# Patient Record
Sex: Male | Born: 1970
Health system: Southern US, Community
[De-identification: ages and names within clinical notes are randomized; demographics above are authoritative.]

## PROBLEM LIST (undated history)

## (undated) DIAGNOSIS — E119 Type 2 diabetes mellitus without complications: Secondary | ICD-10-CM

## (undated) DIAGNOSIS — E785 Hyperlipidemia, unspecified: Secondary | ICD-10-CM

## (undated) DIAGNOSIS — I8390 Asymptomatic varicose veins of unspecified lower extremity: Secondary | ICD-10-CM

## (undated) DIAGNOSIS — K5909 Other constipation: Secondary | ICD-10-CM

## (undated) DIAGNOSIS — J189 Pneumonia, unspecified organism: Secondary | ICD-10-CM

## (undated) DIAGNOSIS — F17200 Nicotine dependence, unspecified, uncomplicated: Secondary | ICD-10-CM

## (undated) HISTORY — DX: Other constipation: K59.09

## (undated) HISTORY — DX: Asymptomatic varicose veins of unspecified lower extremity: I83.90

## (undated) HISTORY — DX: Hyperlipidemia, unspecified: E78.5

## (undated) HISTORY — DX: Pneumonia, unspecified organism: J18.9

## (undated) HISTORY — DX: Nicotine dependence, unspecified, uncomplicated: F17.200

---

## 2012-02-24 ENCOUNTER — Encounter (HOSPITAL_COMMUNITY): Payer: Self-pay | Admitting: Emergency Medicine

## 2012-02-24 ENCOUNTER — Emergency Department (HOSPITAL_COMMUNITY)
Admission: EM | Admit: 2012-02-24 | Discharge: 2012-02-24 | Disposition: A | Discharge status: Home / Self Care | Payer: Self-pay | Source: Home / Self Care

## 2012-02-24 DIAGNOSIS — S058X9A Other injuries of unspecified eye and orbit, initial encounter: Secondary | ICD-10-CM

## 2012-02-24 DIAGNOSIS — R7309 Other abnormal glucose: Secondary | ICD-10-CM

## 2012-02-24 DIAGNOSIS — S0500XA Injury of conjunctiva and corneal abrasion without foreign body, unspecified eye, initial encounter: Secondary | ICD-10-CM

## 2012-02-24 DIAGNOSIS — R739 Hyperglycemia, unspecified: Secondary | ICD-10-CM

## 2012-02-24 HISTORY — DX: Type 2 diabetes mellitus without complications: E11.9

## 2012-02-24 LAB — GLUCOSE, CAPILLARY: Glucose-Capillary: 444 mg/dL — ABNORMAL HIGH (ref 70–99)

## 2012-02-24 MED ORDER — TOBRAMYCIN 0.3 % OP SOLN: 1.0000 [drp] | OPHTHALMIC | Status: DC

## 2012-02-24 NOTE — ED Provider Notes (Signed)
Medical screening examination/treatment/procedure(s) were performed by non-physician practitioner and as supervising physician I was immediately available for consultation/collaboration.  Leslee Home, M.D.   Reuben Likes, MD 02/24/12 2137

## 2012-02-24 NOTE — ED Notes (Signed)
Reports child scratch patient in right eye.  Patient states eye is painful.  Tried flushing eye with water and used eye drops

## 2012-02-24 NOTE — ED Provider Notes (Signed)
History     CSN: [id number]  Arrival date & time 02/24/12  1523   None     Chief Complaint  Patient presents with  . Eye Pain    (Consider location/radiation/quality/duration/timing/severity/associated sxs/prior treatment) Patient is a 41 y.o. male presenting with eye injury. The history is provided by the patient. No language interpreter was used.  Eye Injury This is a new problem. The current episode started yesterday. The problem occurs constantly. The problem has been gradually worsening. Pertinent negatives include no chest pain. Nothing aggravates the symptoms. Nothing relieves the symptoms. He has tried nothing for the symptoms.  Pt reports his child accidentally scratched his eye yesterday  Past Medical History  Diagnosis Date  . Diabetes mellitus without complication     History reviewed. No pertinent past surgical history.  No family history on file.  History  Substance Use Topics  . Smoking status: Not on file  . Smokeless tobacco: Not on file  . Alcohol Use:       Review of Systems  Eyes: Positive for pain and redness.  Cardiovascular: Negative for chest pain.  All other systems reviewed and are negative.    Allergies  Review of patient's allergies indicates no known allergies.  Home Medications   Current Outpatient Rx  Name  Route  Sig  Dispense  Refill  . METFORMIN HCL PO   Oral   Take by mouth.           BP 138/89  Pulse 80  Temp 98.6 F (37 C) (Oral)  Resp 18  SpO2 100%  Physical Exam  Nursing note and vitals reviewed. Constitutional: He appears well-developed and well-nourished.  HENT:  Head: Normocephalic and atraumatic.  Eyes: Conjunctivae normal are normal. Pupils are equal, round, and reactive to light.       Small round area 6oclock right eye has fluro uptake  Neck: Normal range of motion. Neck supple.  Cardiovascular: Normal rate.   Pulmonary/Chest: Effort normal.    ED Course  Procedures (including critical care  time)  Labs Reviewed  GLUCOSE, CAPILLARY - Abnormal; Notable for the following:    Glucose-Capillary 444 (*)     All other components within normal limits   No results found.   No diagnosis found.    MDM  tobrex opth sol.  Pt's glucose level is 444.  Pt did not take his medication.   Pt advised he needs a primary Md and he needs to take his medications    r 20/70 l20/50 both 20/50    Lonia Skinner [place], [place] 02/24/12 1759  Lonia Skinner [place], [place] 02/24/12 1821

## 2012-08-01 ENCOUNTER — Emergency Department (HOSPITAL_COMMUNITY)
Admission: EM | Admit: 2012-08-01 | Discharge: 2012-08-01 | Disposition: A | Discharge status: Home / Self Care | Payer: Self-pay

## 2014-01-12 ENCOUNTER — Ambulatory Visit (INDEPENDENT_AMBULATORY_CARE_PROVIDER_SITE_OTHER): Payer: 59 | Admitting: Medical

## 2014-01-12 ENCOUNTER — Encounter: Payer: Self-pay | Admitting: Medical

## 2014-01-12 VITALS — BP 110/82 | HR 92 | Temp 97.5°F | Resp 16 | Ht 68.0 in | Wt 136.0 lb

## 2014-01-12 DIAGNOSIS — R109 Unspecified abdominal pain: Secondary | ICD-10-CM

## 2014-01-12 DIAGNOSIS — E1165 Type 2 diabetes mellitus with hyperglycemia: Secondary | ICD-10-CM

## 2014-01-12 DIAGNOSIS — K5900 Constipation, unspecified: Secondary | ICD-10-CM

## 2014-01-12 DIAGNOSIS — IMO0002 Reserved for concepts with insufficient information to code with codable children: Secondary | ICD-10-CM

## 2014-01-12 LAB — POCT URINALYSIS DIPSTICK
BILIRUBIN UA: NEGATIVE
Blood, UA: NEGATIVE
GLUCOSE UA: 500
KETONES UA: NEGATIVE
LEUKOCYTES UA: NEGATIVE
Nitrite, UA: NEGATIVE
PH UA: 6
Protein, UA: NEGATIVE
Spec Grav, UA: <=1.005
Urobilinogen, UA: NEGATIVE

## 2014-01-12 LAB — LIPID PANEL
CHOL/HDL RATIO: 3.9 ratio
CHOLESTEROL: 130 mg/dL (ref 0–200)
HDL: 33 mg/dL — ABNORMAL LOW (ref >39)
LDL Cholesterol: 53 mg/dL (ref 0–99)
TRIGLYCERIDES: 220 mg/dL — AB (ref <150)
VLDL: 44 mg/dL — AB (ref 0–40)

## 2014-01-12 LAB — CBC
HCT: 45.4 % (ref 39.0–52.0)
HEMOGLOBIN: 15.7 g/dL (ref 13.0–17.0)
MCH: 28.3 pg (ref 26.0–34.0)
MCHC: 34.6 g/dL (ref 30.0–36.0)
MCV: 81.9 fL (ref 78.0–100.0)
Platelets: 183 10*3/uL (ref 150–400)
RBC: 5.54 MIL/uL (ref 4.22–5.81)
RDW: 13.3 % (ref 11.5–15.5)
WBC: 12 10*3/uL — ABNORMAL HIGH (ref 4.0–10.5)

## 2014-01-12 LAB — COMPREHENSIVE METABOLIC PANEL
ALT: 29 U/L (ref 0–53)
AST: 16 U/L (ref 0–37)
Albumin: 4.4 g/dL (ref 3.5–5.2)
Alkaline Phosphatase: 134 U/L — ABNORMAL HIGH (ref 39–117)
BILIRUBIN TOTAL: 1 mg/dL (ref 0.2–1.2)
BUN: 11 mg/dL (ref 6–23)
CALCIUM: 9.6 mg/dL (ref 8.4–10.5)
CHLORIDE: 97 meq/L (ref 96–112)
CO2: 27 meq/L (ref 19–32)
CREATININE: 0.81 mg/dL (ref 0.50–1.35)
GLUCOSE: 321 mg/dL — AB (ref 70–99)
Potassium: 4.4 mEq/L (ref 3.5–5.3)
Sodium: 134 mEq/L — ABNORMAL LOW (ref 135–145)
TOTAL PROTEIN: 7.9 g/dL (ref 6.0–8.3)

## 2014-01-12 MED ORDER — DOCUSATE SODIUM 100 MG PO CAPS: 100.0000 mg | ORAL_CAPSULE | Freq: Two times a day (BID) | ORAL | Status: DC

## 2014-01-12 MED ORDER — POLYETHYLENE GLYCOL 3350 17 GM/SCOOP PO POWD: 17.0000 g | Freq: Two times a day (BID) | ORAL | Status: DC | PRN

## 2014-01-12 NOTE — Patient Instructions (Addendum)
Constipation:  Begin Miralax 1 cap full in a beverage daily to help with constipation  Begin Colace stool softener, twice daily to help ease stool passage  Make sure you are eating plenty of fiber daily  increase your water intake, preferably 4 liters or more daily  Avoid foods that make you constipated or gassy such as onions, lots of cheese, big portions  We will call back with labs results  If you don't see improvement in 2-3 weeks, we may need to proceed with other evaluation  Constipation Constipation is when a person has fewer than three bowel movements a week, has difficulty having a bowel movement, or has stools that are dry, hard, or larger than normal. As people grow older, constipation is more common. If you try to fix constipation with medicines that make you have a bowel movement (laxatives), the problem may get worse. Long-term laxative use may cause the muscles of the colon to become weak. A low-fiber diet, not taking in enough fluids, and taking certain medicines may make constipation worse.  CAUSES   Certain medicines, such as antidepressants, pain medicine, iron supplements, antacids, and water pills.   Certain diseases, such as diabetes, irritable bowel syndrome (IBS), thyroid disease, or depression.   Not drinking enough water.   Not eating enough fiber-rich foods.   Stress or travel.   Lack of physical activity or exercise.   Ignoring the urge to have a bowel movement.   Using laxatives too much.  SIGNS AND SYMPTOMS   Having fewer than three bowel movements a week.   Straining to have a bowel movement.   Having stools that are hard, dry, or larger than normal.   Feeling full or bloated.   Pain in the lower abdomen.   Not feeling relief after having a bowel movement.  DIAGNOSIS  Your health care provider will take a medical history and perform a physical exam. Further testing may be done for severe constipation. Some tests may  include:  A barium enema X-ray to examine your rectum, colon, and, sometimes, your small intestine.   A sigmoidoscopy to examine your lower colon.   A colonoscopy to examine your entire colon. TREATMENT  Treatment will depend on the severity of your constipation and what is causing it. Some dietary treatments include drinking more fluids and eating more fiber-rich foods. Lifestyle treatments may include regular exercise. If these diet and lifestyle recommendations do not help, your health care provider may recommend taking over-the-counter laxative medicines to help you have bowel movements. Prescription medicines may be prescribed if over-the-counter medicines do not work.  HOME CARE INSTRUCTIONS   Eat foods that have a lot of fiber, such as fruits, vegetables, whole grains, and beans.  Limit foods high in fat and processed sugars, such as french fries, hamburgers, cookies, candies, and soda.   A fiber supplement may be added to your diet if you cannot get enough fiber from foods.   Drink enough fluids to keep your urine clear or pale yellow.   Exercise regularly or as directed by your health care provider.   Go to the restroom when you have the urge to go. Do not hold it.   Only take over-the-counter or prescription medicines as directed by your health care provider. Do not take other medicines for constipation without talking to your health care provider first.  SEEK IMMEDIATE MEDICAL CARE IF:   You have bright red blood in your stool.   Your constipation lasts for more than 4  days or gets worse.   You have abdominal or rectal pain.   You have thin, pencil-like stools.   You have unexplained weight loss. MAKE SURE YOU:   Understand these instructions.  Will watch your condition.  Will get help right away if you are not doing well or get worse. Document Released: 12/23/2003 Document Revised: 03/31/2013 Document Reviewed: 01/05/2013 [facility] Patient  Information 2015 [place], [place]. This information is not intended to replace advice given to you by your health care provider. Make sure you discuss any questions you have with your health care provider.

## 2014-01-12 NOTE — Progress Notes (Signed)
    Subjective:   Gordon Perez is a 43 y.o. male presenting on 01/12/2014 with stomach pain and hard to go to the bathroom  Here as a new patient today, accompanied by wife who is also establishing care today.    Originally from [place], moved to [place] 15 years ago, been in [place].   Has been in usual state of health until 3 weeks ago.  Been having hard to pass stool, small amounts come out, bloated, intermittent belly pain, at times decreased appetite, bloating, gas.  Had to strain very hard this past weekend for BM.   Last 2 weeks having decreased frequency of BMs.  Prior to 3 wk had no issues with gastric problems, no hx/o constipation.    He is not certain he has diabetes, but apparently has been on medication prior.   He drinks some water.  The constipation started after his scheduled changes in mid September.  Coffee did seem to keep things regular but not drinking much coffee since the schedule changes at work/home.  He was on 2 medications for sugar including metformin just last year.     He denies urinary c/o, no blood or mucous in stool, no diarrhea, no nausea or vomiting, no back pain, no weight loss, fever, no family hx/o cancer or GI issues.  No other aggravating or relieving factors.  No other complaint.  Review of Systems ROS as in subjective      Objective:    Filed Vitals:   01/12/14 1113  BP: 110/82  Pulse: 92  Temp: 97.5 F (36.4 C)  Resp: 16    General appearance: alert, no distress, WD/WN, lean [place] male Oral cavity: MMM, some obvious decay in bilat upper posterior molars Neck: supple, no lymphadenopathy, no thyromegaly, no masses Heart: RRR, normal S1, S2, no murmurs Lungs: CTA bilaterally, no wheezes, rhonchi, or rales Abdomen: +bs, soft, non tender, non distended, no masses, no hepatomegaly, no splenomegaly Back: nontender Pulses: 2+ symmetric, upper and lower extremities, normal cap refill DRE - deferred     Assessment: Encounter  Diagnoses  Name Primary?  . Constipation, unspecified constipation type Yes  . Abdominal pain, unspecified abdominal location   . Diabetes type 2, uncontrolled      Plan: Constipation, abdominal pain - etiology seems to be constipation.   Discussed water and fiber intake, can begin Colace and Miralax daily, and will check labs.   handout given.  Diabetes type 2 - he doesn't 100% agree with the diagnosis, says he "was" diabetic, says he is normal now, and was on 2 medications for sugar as of last year.   Labs today for baseline.   [name] was seen today for stomach pain and hard to go to the bathroom.  Diagnoses and associated orders for this visit:  Constipation, unspecified constipation type - CBC - Comprehensive metabolic panel - Lipid panel - Hemoglobin A1c - POCT urinalysis dipstick  Abdominal pain, unspecified abdominal location - POCT urinalysis dipstick  Diabetes type 2, uncontrolled - CBC - Comprehensive metabolic panel - Lipid panel - Hemoglobin A1c - POCT urinalysis dipstick  Other Orders - polyethylene glycol powder (GLYCOLAX/MIRALAX) powder; Take 17 g by mouth 2 (two) times daily as needed. - docusate sodium (COLACE) 100 MG capsule; Take 1 capsule (100 mg total) by mouth 2 (two) times daily.    Return pending labs.

## 2014-01-13 LAB — HEMOGLOBIN A1C
Hgb A1c MFr Bld: 11.8 % — ABNORMAL HIGH (ref <5.7)
Mean Plasma Glucose: 292 mg/dL — ABNORMAL HIGH (ref <117)

## 2014-01-19 ENCOUNTER — Ambulatory Visit (INDEPENDENT_AMBULATORY_CARE_PROVIDER_SITE_OTHER): Payer: 59 | Admitting: Medical

## 2014-01-19 ENCOUNTER — Encounter: Payer: Self-pay | Admitting: Medical

## 2014-01-19 VITALS — BP 110/70 | HR 76 | Temp 97.4°F | Resp 16 | Wt 134.0 lb

## 2014-01-19 DIAGNOSIS — K5900 Constipation, unspecified: Secondary | ICD-10-CM

## 2014-01-19 DIAGNOSIS — E8881 Metabolic syndrome: Secondary | ICD-10-CM

## 2014-01-19 DIAGNOSIS — IMO0002 Reserved for concepts with insufficient information to code with codable children: Secondary | ICD-10-CM

## 2014-01-19 DIAGNOSIS — E1165 Type 2 diabetes mellitus with hyperglycemia: Secondary | ICD-10-CM

## 2014-01-19 MED ORDER — METFORMIN HCL 1000 MG PO TABS: 1000.0000 mg | ORAL_TABLET | Freq: Two times a day (BID) | ORAL | Status: DC

## 2014-01-19 MED ORDER — GLIPIZIDE 5 MG PO TABS: 5.0000 mg | ORAL_TABLET | Freq: Two times a day (BID) | ORAL | Status: DC

## 2014-01-19 NOTE — Progress Notes (Signed)
   Subjective:   Gordon Perez is a 43 y.o. male presenting on 01/19/2014 with Follow-up  Here for f/u from recent visit as a new patient.  So far getting some improvement with Miralax.  Has qeustions about the colace.  Hasn't started metformin as there was confusion over next steps.  He notes diagnosis of diabetes type 2 within last 5 years, but not sure of the date.   [place] state, was begun initially on Metformin and Glipizide.  originally HgbA1C was over 10, but after medications and diet changes, got the HgbA1C down into the 8% range.  He c/t the same medication for a period of time then lost insurance, ran out of medication.  Hasn't been on the medication for several months.     He is very active, always moving, owns a store.   He doesn't eat sweets, fast food, rarely has alcohol.  Meals usually consists of curry with chicken, goat or fish, vegetables, and rice at least twice daily, but not necessarily 3 meals ever day.  Doesn't eat junk food, no soda, no sweet tea.   He has been on no prior medications other than metformin and glipizide.  No other aggravating or relieving factors.  No other complaint.  Review of Systems ROS as in subjective      Objective:     Filed Vitals:   01/19/14 1059  BP: 110/70  Pulse: 76  Temp: 97.4 F (36.3 C)  Resp: 16    General appearance: alert, no distress, WD/WN    Assessment: Encounter Diagnoses  Name Primary?  . Diabetes type 2, uncontrolled Yes  . Constipation, unspecified constipation type   . Insulin resistance      Plan: Discussed diagnosis of diabetes, how it is a chronic disease, possible complications, preventative measures, and importance of good control.   Discussed need for strict diet, exercise, and medications to get to goal.  He refuses any injection today.  He only wants to restart what he was taking prior.  Advised that given the insulin resistance, and the fact that he already practices a pretty healthy  lifestyle and is lean, he really would benefit with basal insulin on board, but he refuses at this time.   We demonstrated use of glucometer, and he will check fasting 2 x /week and keep a log.   He will make some diet modifications we discussed, will try and increase exercise, will begin back on Metformin 1000mg  BID, glipizide 5mg  BID, and recheck in 44mo.    constipation - improved, c/t miralax daily, colace prn.    F/u 3 mo, sooner prn.   [name] was seen today for follow-up.  Diagnoses and associated orders for this visit:  Diabetes type 2, uncontrolled  Constipation, unspecified constipation type  Insulin resistance  Other Orders - metFORMIN (GLUCOPHAGE) 1000 MG tablet; Take 1 tablet (1,000 mg total) by mouth 2 (two) times daily with a meal. - glipiZIDE (GLUCOTROL) 5 MG tablet; Take 1 tablet (5 mg total) by mouth 2 (two) times daily before a meal.     Return in about 3 months (around 04/21/2014).

## 2014-10-24 ENCOUNTER — Encounter (HOSPITAL_COMMUNITY): Payer: Self-pay | Admitting: Emergency Medicine

## 2014-10-24 ENCOUNTER — Emergency Department (INDEPENDENT_AMBULATORY_CARE_PROVIDER_SITE_OTHER)
Admission: EM | Admit: 2014-10-24 | Discharge: 2014-10-24 | Disposition: A | Discharge status: Home / Self Care | Payer: 59 | Source: Home / Self Care | Attending: Family Medicine | Admitting: Family Medicine

## 2014-10-24 DIAGNOSIS — R05 Cough: Secondary | ICD-10-CM | POA: Diagnosis not present

## 2014-10-24 DIAGNOSIS — J209 Acute bronchitis, unspecified: Secondary | ICD-10-CM | POA: Diagnosis not present

## 2014-10-24 DIAGNOSIS — Z720 Tobacco use: Secondary | ICD-10-CM

## 2014-10-24 DIAGNOSIS — R059 Cough, unspecified: Secondary | ICD-10-CM

## 2014-10-24 LAB — POCT RAPID STREP A: Streptococcus, Group A Screen (Direct): NEGATIVE

## 2014-10-24 MED ORDER — METHYLPREDNISOLONE ACETATE 80 MG/ML IJ SUSP
80.0000 mg | Freq: Once | INTRAMUSCULAR | Status: AC
  Administered 2014-10-24: 80 mg via INTRAMUSCULAR

## 2014-10-24 MED ORDER — METHYLPREDNISOLONE ACETATE 80 MG/ML IJ SUSP
INTRAMUSCULAR | Status: AC
  Filled 2014-10-24: qty 1

## 2014-10-24 NOTE — ED Notes (Signed)
C/o cold sx onset 3 days Sx include dry cough, fevers, ST Smokes 1 PPD Taking OTC ibup w/no relief Alert, no signs of acute distress.

## 2014-10-24 NOTE — ED Provider Notes (Signed)
CSN: [id number]     Arrival date & time 10/24/14  1722 History   First MD Initiated Contact with Patient 10/24/14 1744     Chief Complaint  Patient presents with  . URI   (Consider location/radiation/quality/duration/timing/severity/associated sxs/prior Treatment) HPI Comments: Patient is a 44 yo male who presents with 3 days of cough; non-productive. Some malaise and subjective fevers. Mild sore throat. Smokes 1-2 ppd. No sob.   Patient is a 44 y.o. male presenting with URI. The history is provided by the patient.  URI Presenting symptoms: cough, fatigue, fever and sore throat   Associated symptoms: no wheezing     Past Medical History  Diagnosis Date  . Diabetes mellitus without complication   . Smoker    History reviewed. No pertinent past surgical history. No family history on file. History  Substance Use Topics  . Smoking status: Current Every Day Smoker -- 1.00 packs/day for 20 years  . Smokeless tobacco: Not on file  . Alcohol Use: No    Review of Systems  Constitutional: Positive for fever and fatigue. Negative for chills.  HENT: Positive for sore throat. Negative for sinus pressure.   Eyes: Negative.   Respiratory: Positive for cough. Negative for shortness of breath, wheezing and stridor.   Skin: Negative.   Allergic/Immunologic: Negative.   Neurological: Negative.     Allergies  Review of patient's allergies indicates no known allergies.  Home Medications   Prior to Admission medications   Medication Sig Start Date End Date Taking? Authorizing Provider  docusate sodium (COLACE) 100 MG capsule Take 1 capsule (100 mg total) by mouth 2 (two) times daily. 01/12/14   Kermit Baloavid S Tysinger, PA-C  glipiZIDE (GLUCOTROL) 5 MG tablet Take 1 tablet (5 mg total) by mouth 2 (two) times daily before a meal. 01/19/14   Jac Canavanavid S Tysinger, PA-C  metFORMIN (GLUCOPHAGE) 1000 MG tablet Take 1 tablet (1,000 mg total) by mouth 2 (two) times daily with a meal. 01/19/14   Kermit Baloavid S  Tysinger, PA-C  polyethylene glycol powder (GLYCOLAX/MIRALAX) powder Take 17 g by mouth 2 (two) times daily as needed. 01/12/14   Kermit Baloavid S Tysinger, PA-C   BP 118/80 mmHg  Pulse 90  Temp(Src) 98.1 F (36.7 C) (Oral)  Resp 16  SpO2 99% Physical Exam  Constitutional: He is oriented to person, place, and time. He appears well-developed and well-nourished. No distress.  HENT:  Head: Normocephalic and atraumatic.  Right Ear: External ear normal.  Left Ear: External ear normal.  Injection to oro pharynx, no exudate  Cardiovascular: Normal rate and regular rhythm.   No murmur heard. Pulmonary/Chest: Effort normal. He has wheezes.  Mild expiratory wheezes in the bases without crackles or rales  Neurological: He is alert and oriented to person, place, and time.  Skin: Skin is warm and dry. No rash noted. He is not diaphoretic.  Psychiatric: His behavior is normal.  Nursing note and vitals reviewed.   ED Course  Procedures (including critical care time) Labs Review Labs Reviewed  POCT RAPID STREP A    Imaging Review No results found.   MDM   1. Acute bronchitis, unspecified organism   2. Cough   3. Tobacco use    Probable early bronchitis-viral. No indication for antibiotic at this stage. Will provide a DepoMedrol 80mg  IM injection for wheeze/inflammation. Refused inhaler. Use Delsym OTC for cough as directed. Push fluids. Discussed importance of smoking cessation. F/U if worsen.     Riki SheerMichelle G Amiliana Foutz, PA-C 10/24/14 [id number]

## 2014-10-24 NOTE — Discharge Instructions (Signed)
Acute Bronchitis Bronchitis is inflammation of the airways that extend from the windpipe into the lungs (bronchi). The inflammation often causes mucus to develop. This leads to a cough, which is the most common symptom of bronchitis.  In acute bronchitis, the condition usually develops suddenly and goes away over time, usually in a couple weeks. Smoking, allergies, and asthma can make bronchitis worse. Repeated episodes of bronchitis may cause further lung problems.  CAUSES Acute bronchitis is most often caused by the same virus that causes a cold. The virus can spread from person to person (contagious) through coughing, sneezing, and touching contaminated objects. SIGNS AND SYMPTOMS   Cough.   Fever.   Coughing up mucus.   Body aches.   Chest congestion.   Chills.   Shortness of breath.   Sore throat.  DIAGNOSIS  Acute bronchitis is usually diagnosed through a physical exam. Your health care provider will also ask you questions about your medical history. Tests, such as chest X-rays, are sometimes done to rule out other conditions.  TREATMENT  Acute bronchitis usually goes away in a couple weeks. Oftentimes, no medical treatment is necessary. Medicines are sometimes given for relief of fever or cough. Antibiotic medicines are usually not needed but may be prescribed in certain situations. In some cases, an inhaler may be recommended to help reduce shortness of breath and control the cough. A cool mist vaporizer may also be used to help thin bronchial secretions and make it easier to clear the chest.  HOME CARE INSTRUCTIONS  Get plenty of rest.   Drink enough fluids to keep your urine clear or pale yellow (unless you have a medical condition that requires fluid restriction). Increasing fluids may help thin your respiratory secretions (sputum) and reduce chest congestion, and it will prevent dehydration.   Take medicines only as directed by your health care provider.  If  you were prescribed an antibiotic medicine, finish it all even if you start to feel better.  Avoid smoking and secondhand smoke. Exposure to cigarette smoke or irritating chemicals will make bronchitis worse. If you are a smoker, consider using nicotine gum or skin patches to help control withdrawal symptoms. Quitting smoking will help your lungs heal faster.   Reduce the chances of another bout of acute bronchitis by washing your hands frequently, avoiding people with cold symptoms, and trying not to touch your hands to your mouth, nose, or eyes.   Keep all follow-up visits as directed by your health care provider.  SEEK MEDICAL CARE IF: Your symptoms do not improve after 1 week of treatment.  SEEK IMMEDIATE MEDICAL CARE IF:  You develop an increased fever or chills.   You have chest pain.   You have severe shortness of breath.  You have bloody sputum.   You develop dehydration.  You faint or repeatedly feel like you are going to pass out.  You develop repeated vomiting.  You develop a severe headache. MAKE SURE YOU:   Understand these instructions.  Will watch your condition.  Will get help right away if you are not doing well or get worse. Document Released: 05/03/2004 Document Revised: 08/10/2013 Document Reviewed: 09/16/2012 [facility] Patient Information 2015 [place]. This information is not intended to replace advice given to you by your health care provider. Make sure you discuss any questions you have with your health care provider.   This is viral and do not require antibiotics. Please get Delsym over the counter for cough, works great for night. The shot  of steroids will help with the mild wheeze. Must find a way to stop smoking.

## 2014-10-27 LAB — CULTURE, GROUP A STREP

## 2014-10-29 ENCOUNTER — Encounter: Payer: Self-pay | Admitting: Family Medicine

## 2014-10-29 ENCOUNTER — Ambulatory Visit (INDEPENDENT_AMBULATORY_CARE_PROVIDER_SITE_OTHER): Payer: 59 | Admitting: Family Medicine

## 2014-10-29 VITALS — BP 100/70 | HR 80 | Temp 98.0°F | Wt 128.0 lb

## 2014-10-29 DIAGNOSIS — Z720 Tobacco use: Secondary | ICD-10-CM | POA: Diagnosis not present

## 2014-10-29 DIAGNOSIS — IMO0002 Reserved for concepts with insufficient information to code with codable children: Secondary | ICD-10-CM

## 2014-10-29 DIAGNOSIS — E1165 Type 2 diabetes mellitus with hyperglycemia: Secondary | ICD-10-CM | POA: Diagnosis not present

## 2014-10-29 DIAGNOSIS — F17200 Nicotine dependence, unspecified, uncomplicated: Secondary | ICD-10-CM

## 2014-10-29 DIAGNOSIS — J209 Acute bronchitis, unspecified: Secondary | ICD-10-CM | POA: Diagnosis not present

## 2014-10-29 MED ORDER — CLARITHROMYCIN 500 MG PO TABS: 500.0000 mg | ORAL_TABLET | Freq: Two times a day (BID) | ORAL | Status: DC

## 2014-10-29 MED ORDER — HYDROCOD POLST-CPM POLST ER 10-8 MG/5ML PO SUER
5.0000 mL | Freq: Two times a day (BID) | ORAL | Status: DC | PRN
Start: 2014-10-29 — End: 2015-05-24

## 2014-10-29 NOTE — Progress Notes (Signed)
   Subjective:    Patient ID: Gordon Perez, male    DOB: [birth year], 44 y.o.   MRN: [id number]  HPI He is here for an acute visit. He was seen on the 17th in urgent care and treated for bronchitis with a sterile shot. He continues to complain of coughing that is nonproductive. No fever, chills, chest pain, shortness of breath, sore throat or earache. He does smoke but has cut back on that since he has had the difficulty with the coughing. Review of the record indicates he does have underlying diabetes but has not been seen in quite some time.   Review of Systems     Objective:   Physical Exam Alert and in no distress. Tympanic membranes and canals are normal. Pharyngeal area is normal. Neck is supple without adenopathy or thyromegaly. Cardiac exam shows a regular sinus rhythm without murmurs or gallops. Lungs are clear to auscultation. The urgent care visit was reviewed. He was given a sterile injection his strep screen was negative.       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 He is also to set up a follow-up appointment for his diabetes. Encouraged him to remain smoke free.

## 2014-10-29 NOTE — Patient Instructions (Signed)
Take all the antibiotic and use the cough medicine as needed but be careful because it will make you drowsy. No driving and I wouldn't take it and then go to work either

## 2014-10-29 NOTE — ED Notes (Signed)
Final report of strep testing positive for group B hemolytic strep, no r

## 2014-10-29 NOTE — ED Notes (Signed)
Final report of strep positive for group B hemolytic strep, no rx day of visit. Discussed w Dr Artis Flock, who authorized Amoxicillin 500 mg, PO , TID x 30. Called and left detailed message on pharmacy voice mail, store listed as choice on day of visit. Attempted to notify patient, but VM box full

## 2015-05-24 ENCOUNTER — Ambulatory Visit (INDEPENDENT_AMBULATORY_CARE_PROVIDER_SITE_OTHER): Payer: BLUE CROSS/BLUE SHIELD | Admitting: Medical

## 2015-05-24 ENCOUNTER — Encounter: Payer: Self-pay | Admitting: Medical

## 2015-05-24 VITALS — BP 112/90 | HR 77 | Ht 67.5 in | Wt 131.0 lb

## 2015-05-24 DIAGNOSIS — E118 Type 2 diabetes mellitus with unspecified complications: Secondary | ICD-10-CM

## 2015-05-24 DIAGNOSIS — Z720 Tobacco use: Secondary | ICD-10-CM | POA: Diagnosis not present

## 2015-05-24 DIAGNOSIS — Z0000 Encounter for general adult medical examination without abnormal findings: Secondary | ICD-10-CM | POA: Diagnosis not present

## 2015-05-24 DIAGNOSIS — K5900 Constipation, unspecified: Secondary | ICD-10-CM

## 2015-05-24 DIAGNOSIS — F17200 Nicotine dependence, unspecified, uncomplicated: Secondary | ICD-10-CM | POA: Insufficient documentation

## 2015-05-24 DIAGNOSIS — L723 Sebaceous cyst: Secondary | ICD-10-CM

## 2015-05-24 DIAGNOSIS — Z23 Encounter for immunization: Secondary | ICD-10-CM

## 2015-05-24 DIAGNOSIS — K5909 Other constipation: Secondary | ICD-10-CM | POA: Insufficient documentation

## 2015-05-24 LAB — COMPREHENSIVE METABOLIC PANEL
ALT: 21 U/L (ref 9–46)
AST: 17 U/L (ref 10–40)
Albumin: 4.3 g/dL (ref 3.6–5.1)
Alkaline Phosphatase: 108 U/L (ref 40–115)
BUN: 12 mg/dL (ref 7–25)
CHLORIDE: 101 mmol/L (ref 98–110)
CO2: 29 mmol/L (ref 20–31)
CREATININE: 0.76 mg/dL (ref 0.60–1.35)
Calcium: 9.6 mg/dL (ref 8.6–10.3)
GLUCOSE: 196 mg/dL — AB (ref 65–99)
Potassium: 4.7 mmol/L (ref 3.5–5.3)
SODIUM: 137 mmol/L (ref 135–146)
Total Bilirubin: 1.5 mg/dL — ABNORMAL HIGH (ref 0.2–1.2)
Total Protein: 7.5 g/dL (ref 6.1–8.1)

## 2015-05-24 LAB — LIPID PANEL
Cholesterol: 154 mg/dL (ref 125–200)
HDL: 34 mg/dL — ABNORMAL LOW (ref >=40)
LDL CALC: 84 mg/dL (ref <130)
TRIGLYCERIDES: 178 mg/dL — AB (ref <150)
Total CHOL/HDL Ratio: 4.5 Ratio (ref <=5.0)
VLDL: 36 mg/dL — AB (ref <30)

## 2015-05-24 LAB — POCT URINALYSIS DIPSTICK
Bilirubin, UA: NEGATIVE
Blood, UA: NEGATIVE
Ketones, UA: NEGATIVE
Leukocytes, UA: NEGATIVE
Nitrite, UA: NEGATIVE
Protein, UA: NEGATIVE
SPEC GRAV UA: 1.025
UROBILINOGEN UA: NEGATIVE
pH, UA: 6

## 2015-05-24 LAB — CBC
HCT: 46.9 % (ref 39.0–52.0)
Hemoglobin: 15.6 g/dL (ref 13.0–17.0)
MCH: 28.3 pg (ref 26.0–34.0)
MCHC: 33.3 g/dL (ref 30.0–36.0)
MCV: 85 fL (ref 78.0–100.0)
MPV: 11.1 fL (ref 8.6–12.4)
PLATELETS: 180 10*3/uL (ref 150–400)
RBC: 5.52 MIL/uL (ref 4.22–5.81)
RDW: 14 % (ref 11.5–15.5)
WBC: 11.1 10*3/uL — AB (ref 4.0–10.5)

## 2015-05-24 LAB — HEMOGLOBIN A1C
Hgb A1c MFr Bld: 11.1 % — ABNORMAL HIGH (ref <5.7)
Mean Plasma Glucose: 272 mg/dL — ABNORMAL HIGH (ref <117)

## 2015-05-24 NOTE — Progress Notes (Signed)
Subjective:   HPI  Gordon Perez is a 45 y.o. male who presents for a complete physical.  Concerns: Diabetes  - since last visit quit medications.  He was having dizziness with diabetes medications, so stopped. Not compliant with medication.   He notes he is eating healthy.   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    Has lump on right forehead x 10 years, has gotten slightly bigger.  No pain.  No drainage  Sometimes gets some low back.    Hx/o constipation, but no recent problems  Reviewed their medical, surgical, family, social, medication, and allergy history and updated chart as appropriate.  Past Medical History  Diagnosis Date  . Diabetes mellitus without complication (HCC)   . Smoker   . Chronic constipation     Past Surgical History  Procedure Laterality Date  . No past surgeries  05/2015    Social History   Social History  . Marital Status: Married    Spouse Name: N/A  . Number of Children: N/A  . Years of Education: N/A   Occupational History  . Not on file.   Social History Main Topics  . Smoking status: Current Every Day Smoker -- 1.00 packs/day for 20 years  . Smokeless tobacco: Not on file  . Alcohol Use: No  . Drug Use: No  . Sexual Activity: Not on file   Other Topics Concern  . Not on file   Social History Narrative   Married, has 2 children, owns gas station, [employer].   Walking/moving at work.   From [place] originally.    Family History  Problem Relation Age of Onset  . Other Mother     old age  . Other Father     old age  . Diabetes Brother   . Cancer Neg Hx   . Heart disease Neg Hx   . Stroke Neg Hx      Current outpatient prescriptions:  .  clarithromycin (BIAXIN) 500 MG tablet, Take 1 tablet (500 mg total) by mouth 2 (two) times daily. (Patient not taking: Reported on 05/24/2015), Disp: 20 tablet, Rfl: 0 .  docusate sodium (COLACE) 100 MG capsule, Take 1 capsule (100 mg total) by mouth 2 (two) times daily. (Patient not  taking: Reported on 10/29/2014), Disp: 30 capsule, Rfl: 0 .  glipiZIDE (GLUCOTROL) 5 MG tablet, Take 1 tablet (5 mg total) by mouth 2 (two) times daily before a meal. (Patient not taking: Reported on 10/29/2014), Disp: 60 tablet, Rfl: 3 .  metFORMIN (GLUCOPHAGE) 1000 MG tablet, Take 1 tablet (1,000 mg total) by mouth 2 (two) times daily with a meal. (Patient not taking: Reported on 10/29/2014), Disp: 60 tablet, Rfl: 3 .  polyethylene glycol powder (GLYCOLAX/MIRALAX) powder, Take 17 g by mouth 2 (two) times daily as needed. (Patient not taking: Reported on 10/29/2014), Disp: 3350 g, Rfl: 1  No Known Allergies   Review of Systems Constitutional: -fever, -chills, -sweats, -unexpected weight change, -decreased appetite, -fatigue Allergy: -sneezing, -itching, -congestion Dermatology: -changing moles, --rash, -lumps ENT: -runny nose, -ear pain, -sore throat, -hoarseness, -sinus pain, -teeth pain, - ringing in ears, -hearing loss, -nosebleeds Cardiology: -chest pain, -palpitations, -swelling, -difficulty breathing when lying flat, -waking up short of breath Respiratory: -cough, -shortness of breath, -difficulty breathing with exercise or exertion, -wheezing, -coughing up blood Gastroenterology: -abdominal pain, -nausea, -vomiting, -diarrhea, -constipation, -blood in stool, -changes in bowel movement, -difficulty swallowing or eating Hematology: -bleeding, -bruising  Musculoskeletal: -joint aches, -muscle aches, -joint swelling, -  back pain, -neck pain, -cramping, -changes in gait Ophthalmology: denies vision changes, eye redness, itching, discharge Urology: -burning with urination, -difficulty urinating, -blood in urine, -urinary frequency, -urgency, -incontinence Neurology: -headache, -weakness, -tingling, -numbness, -memory loss, -falls, -dizziness Psychology: -depressed mood, -agitation, -sleep problems     Objective:   Physical Exam  BP 112/90 mmHg  Pulse 77  Ht 5' 7.5" (1.715 m)  Wt 131 lb  (59.421 kg)  BMI 20.20 kg/m2  General appearance: alert, no distress, WD/WN, [place] male Skin: dry, otherwise no worrisome lesions HEENT: normocephalic, conjunctiva/corneas normal, sclerae anicteric, PERRLA, EOMi, nares patent, no discharge or erythema, pharynx normal Oral cavity: MMM, tongue normal, teeth - lots of stain, left upper molar with decay Neck: supple, no lymphadenopathy, no thyromegaly, no masses, normal ROM, no bruits Chest: non tender, normal shape and expansion Heart: RRR, normal S1, S2, no murmurs Lungs: CTA bilaterally, no wheezes, rhonchi, or rales Abdomen: +bs, soft, non tender, non distended, no masses, no hepatomegaly, no splenomegaly, no bruits Back: non tender, normal ROM, no scoliosis Musculoskeletal: upper extremities non tender, no obvious deformity, normal ROM throughout, lower extremities non tender, no obvious deformity, normal ROM throughout Extremities: no edema, no cyanosis, no clubbing Pulses: 2+ symmetric, upper and lower extremities, normal cap refill Neurological: alert, oriented x 3, CN2-12 intact, strength normal upper extremities and lower extremities, sensation normal throughout, DTRs 2+ throughout, no cerebellar signs, gait normal Psychiatric: normal affect, behavior normal, pleasant  GU: normal male external genitalia, uncircumcised, nontender, no masses, no hernia, no lymphadenopathy Rectal: deferred  Diabetic Foot Exam - Simple   Simple Foot Form  Diabetic Foot exam was performed with the following findings:  Yes 05/24/2015 10:07 AM  Visual Inspection  No deformities, no ulcerations, no other skin breakdown bilaterally:  Yes  Sensation Testing  Intact to touch and monofilament testing bilaterally:  Yes  Pulse Check  Posterior Tibialis and Dorsalis pulse intact bilaterally:  Yes  Comments        Assessment and Plan :   \ Encounter Diagnoses  Name Primary?  . Encounter for health maintenance examination in adult Yes  . Diabetes  mellitus with complication (HCC)   . Smoker   . Sebaceous cyst   . Need for prophylactic vaccination against Streptococcus pneumoniae (pneumococcus)   . Need for prophylactic vaccination and inoculation against influenza   . Chronic constipation     Physical exam - discussed healthy lifestyle, diet, exercise, preventative care, vaccinations, and addressed their concerns.   Diabetes - noncompliance.   Labs today, plan to restart metformin without glipizide.  discussed diet, exercise, compliance, f/u Smoker- advised cessation sebaceous cyst - reassured, but if it gets bigger or if he wants it off, then will let me know about dermatology referral Counseled on the pneumococcal vaccine.  Vaccine information sheet given.  Pneumococcal vaccine PPSV23 given after consent obtained. Counseled on the influenza virus vaccine.  Vaccine information sheet given.  Influenza vaccine given after consent obtained. Chronic constipation - not currently a problem Follow-up pending labs

## 2015-05-24 NOTE — Patient Instructions (Addendum)
Encounter Diagnoses  Name Primary?  . Encounter for health maintenance examination in adult Yes  . Chronic constipation   . Diabetes mellitus with complication (HCC)   . Smoker   . Sebaceous cyst      Recommendations:  If the cyst gets bigger, then let me know and we will refer you to dermatology See your eye doctor yearly for routine vision care. See your dentist yearly for routine dental care including hygiene visits twice yearly. Exercise regularly Eat a healthy low sugar diet We updated your flu shot today We updated your pneumonia shot today

## 2015-05-24 NOTE — Addendum Note (Signed)
Addended by: Kieth Brightly on: 05/24/2015 10:14 AM   Modules accepted: Orders, SmartSet

## 2015-05-25 ENCOUNTER — Telehealth: Payer: Self-pay

## 2015-05-25 ENCOUNTER — Other Ambulatory Visit: Payer: Self-pay | Admitting: Medical

## 2015-05-25 DIAGNOSIS — E118 Type 2 diabetes mellitus with unspecified complications: Secondary | ICD-10-CM

## 2015-05-25 DIAGNOSIS — E108 Type 1 diabetes mellitus with unspecified complications: Principal | ICD-10-CM

## 2015-05-25 DIAGNOSIS — IMO0002 Reserved for concepts with insufficient information to code with codable children: Secondary | ICD-10-CM

## 2015-05-25 DIAGNOSIS — E1065 Type 1 diabetes mellitus with hyperglycemia: Secondary | ICD-10-CM

## 2015-05-25 LAB — MICROALBUMIN / CREATININE URINE RATIO
CREATININE, URINE: 83 mg/dL (ref 20–370)
Microalb Creat Ratio: 13 mcg/mg creat (ref <30)
Microalb, Ur: 1.1 mg/dL

## 2015-05-25 LAB — TSH: TSH: 1.14 m[IU]/L (ref 0.40–4.50)

## 2015-05-25 MED ORDER — BLOOD GLUCOSE MONITOR KIT: PACK | Status: AC

## 2015-05-25 MED ORDER — METFORMIN HCL 1000 MG PO TABS: 1000.0000 mg | ORAL_TABLET | Freq: Two times a day (BID) | ORAL | Status: DC

## 2015-05-25 MED ORDER — EMPAGLIFLOZIN-LINAGLIPTIN 10-5 MG PO TABS: 1.0000 | ORAL_TABLET | Freq: Every day | ORAL | Status: DC

## 2015-05-25 NOTE — Telephone Encounter (Signed)
Ordered glucometer with supplies. Pt is aware and called in the glucometer.

## 2015-05-25 NOTE — Telephone Encounter (Signed)
-----   Message from Jac Canavan, PA-C sent at 05/25/2015 11:14 AM EST ----- Certainly offer a f/u visit to discuss  1) its hard to control sugars in the 200-300s with oral medications alone.   Nevertheless, restart Metformin  BID.   Once he is tolerating this after a week, then add a once daily medication called Glyxambi. 2) glyxambi (give coupon card and samples) may or may not be covered on insurance but we will try.  This is a combination oral medication that does make a good impact on sugars.  He needs to increase water intake on this medication.  3) call out glucometer, testing supplies, lancet, strips #100 and 5 refills, and have him check sugar daily in the morning. 4) f/u 83mo! Or sooner if problems or if he wants to come in and discuss first

## 2015-05-31 ENCOUNTER — Ambulatory Visit: Payer: Self-pay | Admitting: Medical

## 2015-06-02 ENCOUNTER — Encounter: Payer: Self-pay | Admitting: Medical

## 2015-06-02 ENCOUNTER — Ambulatory Visit (INDEPENDENT_AMBULATORY_CARE_PROVIDER_SITE_OTHER): Payer: BLUE CROSS/BLUE SHIELD | Admitting: Medical

## 2015-06-02 VITALS — BP 112/80 | HR 73 | Wt 131.0 lb

## 2015-06-02 DIAGNOSIS — E118 Type 2 diabetes mellitus with unspecified complications: Secondary | ICD-10-CM

## 2015-06-02 DIAGNOSIS — Z720 Tobacco use: Secondary | ICD-10-CM | POA: Diagnosis not present

## 2015-06-02 DIAGNOSIS — E786 Lipoprotein deficiency: Secondary | ICD-10-CM | POA: Diagnosis not present

## 2015-06-02 DIAGNOSIS — F17200 Nicotine dependence, unspecified, uncomplicated: Secondary | ICD-10-CM

## 2015-06-02 MED ORDER — EMPAGLIFLOZIN-LINAGLIPTIN 10-5 MG PO TABS: 1.0000 | ORAL_TABLET | Freq: Every day | ORAL | Status: DC

## 2015-06-02 NOTE — Progress Notes (Signed)
Subjective: Chief Complaint  Patient presents with  . Follow-up    diabetes. fasting sugar this am was 180.     Here to discuss diabetes further.   At his recent visit his glucose was close to 200 fasting, HgbA1C was 11.1% about the same as a year ago.    Last visit he was adamant about not using anything but Metformin.   He reports dizziness worse with Glipizide, mild dizziness with Metformin.   Eats usually twice daily, avoids lots of carbs.     Past Medical History  Diagnosis Date  . Diabetes mellitus without complication (HCC)   . Smoker   . Chronic constipation    ROS as in subjective  Objective BP 112/80 mmHg  Pulse 73  Wt 131 lb (59.421 kg)  Gen: wd, wn, nad   Assessment: Encounter Diagnoses  Name Primary?  . Diabetes mellitus with complication (HCC) Yes  . Smoker   . Low HDL (under 40)     Plan: discussed his recent labs.  I am concerned that he is not making a lot of insulin.  discussed his labs, HgbA1C over 10% and they fact that he may require certain medications including insulin to get sugars under control.  discussed diabetes pathology, difference between Type 1, type, medications that may or may not work. discussed signs of DKA and symptomatic diabetes to watch for.  He is only agreeable to start Glyxambi and continue metformin.  Not willing to try ANY injectable or sulfonylureas at this time.  I advised that he is limiting what I can do to help him.   He is willing to try Metformin + Glyxambi for the next 1-2 mo then recheck labs including C-peptide and Insulin.   Reiterated need for daily monitoring glucose at home.   He started checking glucose today.

## 2015-06-02 NOTE — Patient Instructions (Signed)
Type 1 Diabetes Mellitus, Adult Type 1 diabetes mellitus, often simply referred to as diabetes, is a long-term (chronic) disease. It occurs when the islet cells in the pancreas that make insulin (a hormone) are destroyed and can no longer make insulin. Insulin is needed to move sugars from food into the tissue cells. The tissue cells use the sugars for energy. In people with type 1 diabetes, the sugars build up in the blood instead of going into the tissue cells. As a result, high blood sugar (hyperglycemia) develops. Without insulin, the body breaks down fat cells for the needed energy. This breakdown of fat cells produces acid chemicals (ketones), which increases the acid levels in the body. The effect of either high ketone or high sugar (glucose) levels can be life-threatening.  Type 1 diabetes was also previously called juvenile diabetes. It most often occurs before the age of 30, but it can occur at any age. RISK FACTORS A person is predisposed to developing type 1 diabetes if someone in his or her family has the disease and is exposed to certain additional environmental triggers.  SYMPTOMS  Symptoms of type 1 diabetes may develop gradually over days to weeks or suddenly. The symptoms occur due to hyperglycemia. The symptoms can include:   Increased thirst (polydipsia).  Increased urination (polyuria).  Increased urination during the night (nocturia).  Weight loss. This weight loss may be rapid.  Frequent, recurring infections.  Tiredness (fatigue).  Weakness.  Vision changes, such as blurred vision.  Fruity smell to your breath.  Abdominal pain.  Nausea or vomiting.  An open skin wound (ulcer). DIAGNOSIS  Type 1 diabetes is diagnosed when symptoms of diabetes are present and when blood glucose levels are increased. Your blood glucose level may be checked by one or more of the following blood tests:  A fasting blood glucose test. You will not be allowed to eat for at least 8  hours before a blood sample is taken.  A random blood glucose test. Your blood glucose is checked at any time of the day regardless of when you ate.  A hemoglobin A1c blood glucose test. A hemoglobin A1c test provides information about blood glucose control over the previous 3 months. TREATMENT  Although type 1 diabetes cannot be prevented, it can be managed with insulin, diet, and exercise.  You will need to take insulin daily to keep blood glucose in the desired range.  You will need to match insulin dosing with exercise and healthy food choices. Generally, the goal of treatment is to maintain a pre-meal (preprandial) blood glucose level of 80-130 mg/dL. HOME CARE INSTRUCTIONS   Have your hemoglobin A1c level checked twice a year.  Perform daily blood glucose monitoring as directed by your health care provider.  Monitor urine ketones when you are ill and as directed by your health care provider.  Take your insulin as directed by your health care provider to maintain your blood glucose level in the desired range.  Never run out of insulin. It is needed every day.  Adjust insulin based on your intake of carbohydrates. Carbohydrates can raise blood glucose levels but need to be included in your diet. Carbohydrates provide vitamins, minerals, and fiber, which are an essential part of a healthy diet. Carbohydrates are found in fruits, vegetables, whole grains, dairy products, legumes, and foods containing added sugars.  Eat healthy foods. Alternate 3 meals with 3 snacks.  Maintain a healthy weight.  Carry a medical alert card or wear your medical alert   jewelry.  Carry a 15-gram carbohydrate snack with you at all times to treat low blood glucose (hypoglycemia). Some examples of 15-gram carbohydrate snacks include:  Glucose tablets, 3 or 4.  Glucose gel, 15-gram tube.  Raisins, 2 tablespoons (24 grams).  Jelly beans, 6.  Animal crackers, 8.  Fruit juice, regular soda, or  low-fat milk, 4 ounces (120 mL).  Gummy treats, 9.  Recognize hypoglycemia. Hypoglycemia occurs with blood glucose levels of 70 mg/dL and below. The risk for hypoglycemia increases when fasting or skipping meals, during or after intense exercise, and during sleep. Hypoglycemia symptoms can include:  Tremors or shakes.  Decreased ability to concentrate.  Sweating.  Increased heart rate.  Headache.  Dry mouth.  Hunger.  Irritability.  Anxiety.  Restless sleep.  Altered speech or coordination.  Confusion.  Treat hypoglycemia promptly. If you are alert and able to safely swallow, follow the 15:15 rule:  Take 15-20 grams of rapid-acting glucose or carbohydrate. Rapid-acting options include glucose gel, glucose tablets, or 4 ounces (120 mL) of fruit juice, regular soda, or low-fat milk.  Check your blood glucose level 15 minutes after taking the glucose.  Take 15-20 grams more of glucose if the repeat blood glucose level is still 70 mg/dL or below.  Eat a meal or snack within 1 hour once blood glucose levels return to normal.  Be alert to polyuria and polydipsia, which are early signs of hyperglycemia. An early awareness of hyperglycemia allows for prompt treatment. Treat hyperglycemia as directed by your health care provider.  Exercise regularly as directed by your health care provider. This includes:  Stretching and performing strength training exercises, such as yoga or weight lifting, at least 2 times per week.  Performing a total of at least 150 minutes of moderate-intensity exercise each week, such as brisk walking or water aerobics.  Exercising at least 3 days per week, making sure you allow no more than 2 consecutive days to pass without exercising.  Avoiding long periods of inactivity (90 minutes or more). When you have to spend an extended period of time sitting down, take frequent breaks to walk or stretch.  Adjust your insulin dosing and food intake as needed  if you start a new exercise or sport.  Follow your sick-day plan at any time you are unable to eat or drink as usual.   Do not use any tobacco products including cigarettes, chewing tobacco, or electronic cigarettes. If you need help quitting, ask your health care provider.  Limit alcohol intake to no more than 1 drink per day for nonpregnant women and 2 drinks per day for men. You should drink alcohol only when you are also eating food. Talk with your health care provider about whether alcohol is safe for you. Tell your health care provider if you drink alcohol several times a week.  Keep all follow-up visits as directed by your health care provider.  Schedule an eye exam within 5 years of diagnosis and then annually.  Perform daily skin and foot care. Examine your skin and feet daily for cuts, bruises, redness, nail problems, bleeding, blisters, or sores. A foot exam should be done by a health care provider 5 years after diagnosis, and then every year after the first exam.  Brush your teeth and gums at least twice a day and floss at least once a day. Follow up with your dentist regularly.  Share your diabetes management plan with your workplace or school.  Keep your immunizations up to date. It   is recommended that you receive a flu (influenza) vaccine every year. It is also recommended that you receive a pneumonia (pneumococcal) vaccine. If you are 65 years of age or older and have never received a pneumonia vaccine, this vaccine may be given as a series of two separate shots. Ask your health care provider which additional vaccines may be recommended.  Learn to manage stress.  Obtain ongoing diabetes education and support as needed.  Participate in or seek rehabilitation as needed to maintain or improve independence and quality of life. Request a physical or occupational therapy referral if you are having foot or hand numbness, or difficulties with grooming, dressing, eating, or physical  activity. SEEK MEDICAL CARE IF:   You are unable to eat food or drink fluids for more than 6 hours.  You have nausea and vomiting for more than 6 hours.  Your blood glucose level is over 240 mg/dL.  There is a change in mental status.  You develop an additional serious illness.  You have diarrhea for more than 6 hours.  You have been sick or have had a fever for a couple of days and are not getting better.  You have pain during any physical activity. SEEK IMMEDIATE MEDICAL CARE IF:  You have difficulty breathing.  You have moderate to large ketone levels. MAKE SURE YOU:  Understand these instructions.  Will watch your condition.  Will get help right away if you are not doing well or get worse.   This information is not intended to replace advice given to you by your health care provider. Make sure you discuss any questions you have with your health care provider.   Document Released: 03/23/2000 Document Revised: 12/15/2014 Document Reviewed: 10/23/2011 Elsevier Interactive Patient Education 2016 [employer]  

## 2015-06-04 ENCOUNTER — Telehealth: Payer: Self-pay | Admitting: Medical

## 2015-06-08 ENCOUNTER — Other Ambulatory Visit: Payer: Self-pay | Admitting: Medical

## 2015-06-08 MED ORDER — CANAGLIFLOZIN 100 MG PO TABS: 100.0000 mg | ORAL_TABLET | Freq: Every day | ORAL | Status: DC

## 2015-06-08 MED ORDER — SAXAGLIPTIN HCL 5 MG PO TABS: 5.0000 mg | ORAL_TABLET | Freq: Every day | ORAL | Status: DC

## 2015-06-08 NOTE — Telephone Encounter (Signed)
Yes  I want him to try both medications which is similar to taking Glyxambi.  Make sure he is aware, f/u 34mo

## 2015-06-08 NOTE — Telephone Encounter (Signed)
P.A. Gibson Ramp, pt must try Onglyza and Invokana Do you want to switch?

## 2015-06-09 NOTE — Telephone Encounter (Signed)
Called pt and informed, called pharmacy and both went thru for $20 each.

## 2016-02-13 ENCOUNTER — Other Ambulatory Visit: Payer: Self-pay | Admitting: Medical

## 2016-02-20 ENCOUNTER — Encounter: Payer: Self-pay | Admitting: Medical

## 2016-02-20 ENCOUNTER — Ambulatory Visit (INDEPENDENT_AMBULATORY_CARE_PROVIDER_SITE_OTHER): Payer: BLUE CROSS/BLUE SHIELD | Admitting: Medical

## 2016-02-20 VITALS — BP 142/70 | Wt 137.5 lb

## 2016-02-20 DIAGNOSIS — Z2821 Immunization not carried out because of patient refusal: Secondary | ICD-10-CM | POA: Diagnosis not present

## 2016-02-20 DIAGNOSIS — Z9119 Patient's noncompliance with other medical treatment and regimen: Secondary | ICD-10-CM

## 2016-02-20 DIAGNOSIS — R030 Elevated blood-pressure reading, without diagnosis of hypertension: Secondary | ICD-10-CM

## 2016-02-20 DIAGNOSIS — F17200 Nicotine dependence, unspecified, uncomplicated: Secondary | ICD-10-CM

## 2016-02-20 DIAGNOSIS — E118 Type 2 diabetes mellitus with unspecified complications: Secondary | ICD-10-CM | POA: Diagnosis not present

## 2016-02-20 DIAGNOSIS — Z91199 Patient's noncompliance with other medical treatment and regimen due to unspecified reason: Secondary | ICD-10-CM

## 2016-02-20 NOTE — Progress Notes (Signed)
Subjective: Chief Complaint  Patient presents with  . Diabetes    Here for routine f/u on diabetes. Last visit 2/ 2017.  At that time we recommended glyxambi which wasn't covered by insurance, then tried invokana which was also apparently too costly.  Thus, he continues on Metformion BID only.  He does note trying to eat healthy, exercises with activity on the job.  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   Sugars run low 100s to up to 160.   If he sometimes drinks beer gets higher numbers.  Checks feet, no concerns, saew eye doctrot few months ago.    He decliens flu shot  Past Medical History:  Diagnosis Date  . Chronic constipation   . Diabetes mellitus without complication (HCC)   . Smoker    ROS as in subjective  Objective BP (!) 142/70   Wt 137 lb 8 oz (62.4 kg)   SpO2 99%   BMI 21.22 kg/m   Gen: wd, wn, nad Heart: rrr, normal s1, s2, no murmurs Lungs clear Ext: no edema Pulses WNL   Diabetic Foot Exam - Simple   Simple Foot Form Visual Inspection No deformities, no ulcerations, no other skin breakdown bilaterally:  Yes Sensation Testing Intact to touch and monofilament testing bilaterally:  Yes Pulse Check See comments:  Yes Comments 1+ pedal pulses       Assessment: Encounter Diagnoses  Name Primary?  . Diabetes mellitus with complication (HCC) Yes  . Smoker   . Elevated blood-pressure reading without diagnosis of hypertension   . Influenza vaccination declined   . Noncompliance     Plan: Labs today Discussed need for better compliance with f/u, medications. C/t glucose monitoring C/t metfmroinng Elevated BP today.  C/t to monitor He may end up getting flu shot cheaper elsewhere Advised smoking cessation Offered PSA and cancer screens.  He will recheck in a few months for a yearly phsical after first of the year in 2018  Gordon Perez was seen today for diabetes.  Diagnoses and all orders for this visit:  Diabetes mellitus  with complication (HCC) -     Comprehensive metabolic panel -     Hemoglobin A1c -     Insulin, random -     C-peptide -     [facility] DIABETES EYE EXAM -     [facility] DIABETES FOOT EXAM  Smoker  Elevated blood-pressure reading without diagnosis of hypertension  Influenza vaccination declined  Noncompliance

## 2016-02-20 NOTE — Patient Instructions (Signed)
Recommendations:  Next visit consider a physical which includes recommendations for cancer screens  Consider follow up every 3 months for diabetes check    Colonoscopy A colonoscopy is an exam to look at the entire large intestine (colon). This exam can help find problems such as tumors, polyps, inflammation, and areas of bleeding. The exam takes about 1 hour.  [facility] PROVIDER KNOW ABOUT:   Any allergies you have.  All medicines you are taking, including vitamins, herbs, eye drops, creams, and over-the-counter medicines.  Previous problems you or members of your family have had with the use of anesthetics.  Any blood disorders you have.  Previous surgeries you have had.  Medical conditions you have. RISKS AND COMPLICATIONS  Generally, this is a safe procedure. However, as with any procedure, complications can occur. Possible complications include:  Bleeding.  Tearing or rupture of the colon wall.  Reaction to medicines given during the exam.  Infection (rare). BEFORE THE PROCEDURE   Ask your health care provider about changing or stopping your regular medicines.  You may be prescribed an oral bowel prep. This involves drinking a large amount of medicated liquid, starting the day before your procedure. The liquid will cause you to have multiple loose stools until your stool is almost clear or light green. This cleans out your colon in preparation for the procedure.  Do not eat or drink anything else once you have started the bowel prep, unless your health care provider tells you it is safe to do so.  Arrange for someone to drive you home after the procedure. PROCEDURE   You will be given medicine to help you relax (sedative).  You will lie on your side with your knees bent.  A long, flexible tube with a light and camera on the end (colonoscope) will be inserted through the rectum and into the colon. The camera sends video back to a computer screen as it  moves through the colon. The colonoscope also releases carbon dioxide gas to inflate the colon. This helps your health care provider see the area better.  During the exam, your health care provider may take a small tissue sample (biopsy) to be examined under a microscope if any abnormalities are found.  The exam is finished when the entire colon has been viewed. AFTER THE PROCEDURE   Do not drive for 24 hours after the exam.  You may have a small amount of blood in your stool.  You may pass moderate amounts of gas and have mild abdominal cramping or bloating. This is caused by the gas used to inflate your colon during the exam.  Ask when your test results will be ready and how you will get your results. Make sure you get your test results.   This information is not intended to replace advice given to you by your health care provider. Make sure you discuss any questions you have with your health care provider.   Document Released: 03/23/2000 Document Revised: 01/14/2013 Document Reviewed: 12/01/2012 Elsevier Interactive Patient Education 2016 [employer]    Prostate-Specific Antigen Test WHY AM I HAVING THIS TEST? The prostate-specific antigen (PSA) test is performed to determine how much PSA you have in your blood. PSA is a type of protein that is normally present in the prostate gland. Certain conditions can cause PSA blood levels to increase, such as:  Infection in the prostate (prostatitis).  Enlargement of the prostate (hypertrophy).  Prostate cancer. Because PSA levels increase greatly from prostate  cancer, this test can be used to confirm a diagnosis of prostate cancer. It may also be used to monitor treatment for prostate cancer and to watch for a return of prostate cancer after treatment has finished.  This test has a very high false-positive rate. Therefore, routine PSA screening for all men is no longer recommended. A false-positive result is incorrect because it  indicates a condition or finding is present when it is not.  WHAT KIND OF SAMPLE IS TAKEN?  A blood sample is required for this test. It is usually collected by inserting a needle into a vein or by sticking a finger with a small needle. HOW DO I PREPARE FOR THE TEST? There is no preparation required for this test. However, there are factors that can affect the results of a PSA test. To get the most accurate results:  Avoid having a rectal exam within several hours before having your blood drawn for this test.   Avoid having any procedures performed on the prostate gland within 6 weeks of having this test.   Avoid ejaculating within 24 hours of having this test.   Tell your health care provider if you had a recent urinary tract infection (UTI).  Tell your health care provider if you are taking medicines to assist with hair growth, such as finasteride.  Tell your health care provider if you have been exposed to a medicine called diethylstilbestrol. Let your health care provider know if any of these factors apply to you. You may be asked to reschedule the test. WHAT ARE THE REFERENCE RANGES? Reference ranges are established after testing a large group of people. Reference ranges may vary among different people, labs, and hospitals. It is your responsibility to obtain your test results. Ask the lab or department performing the test when and how you will get your results.  Low: 0-2.5 ng/mL.  Slightly to moderately elevated: 2.6-10.0 ng/mL.  Moderately elevated: 10.0-19.9 ng/mL.  Significantly elevated: 20 ng/mL or greater. WHAT DO THE RESULTS MEAN? PSA test results greater than 4 ng/mL are found in the majority of men with prostate cancer. If your test result is above this level, this can indicate an increased risk for prostate cancer. Increased PSA levels can also indicate other health conditions. Talk with your health care provider to discuss your results, treatment options, and if  necessary, the need for more tests. Talk with your health care provider if you have any questions about your results.   This information is not intended to replace advice given to you by your health care provider. Make sure you discuss any questions you have with your health care provider.   Document Released: 04/28/2004 Document Revised: 12/15/2014 Document Reviewed: 08/19/2013 Elsevier Interactive Patient Education [employer]2016 Elsevier Inc.

## 2016-02-21 ENCOUNTER — Other Ambulatory Visit: Payer: Self-pay | Admitting: Medical

## 2016-02-21 LAB — HEMOGLOBIN A1C
HEMOGLOBIN A1C: 8.3 % — AB (ref <5.7)
MEAN PLASMA GLUCOSE: 192 mg/dL

## 2016-02-21 LAB — COMPREHENSIVE METABOLIC PANEL
ALBUMIN: 4.3 g/dL (ref 3.6–5.1)
ALK PHOS: 92 U/L (ref 40–115)
ALT: 25 U/L (ref 9–46)
AST: 20 U/L (ref 10–40)
BILIRUBIN TOTAL: 0.6 mg/dL (ref 0.2–1.2)
BUN: 12 mg/dL (ref 7–25)
CALCIUM: 9.5 mg/dL (ref 8.6–10.3)
CO2: 28 mmol/L (ref 20–31)
CREATININE: 0.86 mg/dL (ref 0.60–1.35)
Chloride: 101 mmol/L (ref 98–110)
Glucose, Bld: 171 mg/dL — ABNORMAL HIGH (ref 65–99)
Potassium: 4.8 mmol/L (ref 3.5–5.3)
SODIUM: 138 mmol/L (ref 135–146)
TOTAL PROTEIN: 7.4 g/dL (ref 6.1–8.1)

## 2016-02-21 LAB — INSULIN, RANDOM: INSULIN: 16.8 u[IU]/mL (ref 2.0–19.6)

## 2016-02-21 LAB — C-PEPTIDE: C-Peptide: 3.34 ng/mL (ref 0.80–3.85)

## 2016-02-21 MED ORDER — PRAVASTATIN SODIUM 20 MG PO TABS: 20.0000 mg | ORAL_TABLET | Freq: Every day | ORAL | 1 refills | Status: DC

## 2016-02-21 MED ORDER — EMPAGLIFLOZIN-METFORMIN HCL 12.5-1000 MG PO TABS: 1.0000 | ORAL_TABLET | Freq: Two times a day (BID) | ORAL | 1 refills | Status: DC

## 2016-06-28 ENCOUNTER — Other Ambulatory Visit: Payer: Self-pay | Admitting: Medical

## 2016-08-08 ENCOUNTER — Ambulatory Visit (INDEPENDENT_AMBULATORY_CARE_PROVIDER_SITE_OTHER): Payer: Self-pay | Admitting: Medical

## 2016-08-08 VITALS — BP 120/80 | HR 88 | Wt 126.8 lb

## 2016-08-08 DIAGNOSIS — I8390 Asymptomatic varicose veins of unspecified lower extremity: Secondary | ICD-10-CM

## 2016-08-08 DIAGNOSIS — E785 Hyperlipidemia, unspecified: Secondary | ICD-10-CM

## 2016-08-08 DIAGNOSIS — E118 Type 2 diabetes mellitus with unspecified complications: Secondary | ICD-10-CM

## 2016-08-08 DIAGNOSIS — F17200 Nicotine dependence, unspecified, uncomplicated: Secondary | ICD-10-CM

## 2016-08-08 MED ORDER — EMPAGLIFLOZIN-METFORMIN HCL 12.5-1000 MG PO TABS: 1.0000 | ORAL_TABLET | Freq: Two times a day (BID) | ORAL | 1 refills | Status: DC

## 2016-08-08 MED ORDER — PRAVASTATIN SODIUM 20 MG PO TABS: 20.0000 mg | ORAL_TABLET | Freq: Every day | ORAL | 1 refills | Status: DC

## 2016-08-08 NOTE — Patient Instructions (Signed)
Check with local pharmacy (not [facility]), or [facility] for compression hose  [facility] Address: [address], [place], [place] [address] Phone: [phone]  [facility] Address: [address] [place], [place] [address] Phone: [phone]  Return next week 8:30am fasting for labs.  Don't eat after midnight the night before.      Varicose Veins Varicose veins are veins that have become enlarged and twisted. They are usually seen in the legs but can occur in other parts of the body as well. What are the causes? This condition is the result of valves in the veins not working properly. Valves in the veins help to return blood from the leg to the heart. If these valves are damaged, blood flows backward and backs up into the veins in the leg near the skin. This causes the veins to become larger. What increases the risk? People who are on their feet a lot, who are pregnant, or who are overweight are more likely to develop varicose veins. What are the signs or symptoms?  Bulging, twisted-appearing, bluish veins, most commonly found on the legs.  Leg pain or a feeling of heaviness. These symptoms may be worse at the end of the day.  Leg swelling.  Changes in skin color. How is this diagnosed? A health care provider can usually diagnose varicose veins by examining your legs. Your health care provider may also recommend an ultrasound of your leg veins. How is this treated? Most varicose veins can be treated at home.However, other treatments are available for people who have persistent symptoms or want to improve the cosmetic appearance of the varicose veins. These treatment options include:  Sclerotherapy. A solution is injected into the vein to close it off.  Laser treatment. A laser is used to heat the vein to close it off.  Radiofrequency vein ablation. An electrical current produced by radio waves is used to close off the  vein.  Phlebectomy. The vein is surgically removed through small incisions made over the varicose vein.  Vein ligation and stripping. The vein is surgically removed through incisions made over the varicose vein after the vein has been tied (ligated). Follow these instructions at home:   Do not stand or sit in one position for long periods of time. Do not sit with your legs crossed. Rest with your legs raised during the day.  Wear compression stockings as directed by your health care provider. These stockings help to prevent blood clots and reduce swelling in your legs.  Do not wear other tight, encircling garments around your legs, pelvis, or waist.  Walk as much as possible to increase blood flow.  Raise the foot of your bed at night with 2-inch blocks.  If you get a cut in the skin over the vein and the vein bleeds, lie down with your leg raised and press on it with a clean cloth until the bleeding stops. Then place a bandage (dressing) on the cut. See your health care provider if it continues to bleed. Contact a health care provider if:  The skin around your ankle starts to break down.  You have pain, redness, tenderness, or hard swelling in your leg over a vein.  You are uncomfortable because of leg pain. This information is not intended to replace advice given to you by your health care provider. Make sure you discuss any questions you have with your health care provider. Document Released: 01/03/2005 Document Revised: 09/01/2015 Document Reviewed: 09/27/2015 Elsevier Interactive  Patient Education  2017 [employer].

## 2016-08-08 NOTE — Progress Notes (Signed)
Subjective: Chief Complaint  Patient presents with  . diabetes follow-up    diabetes follow-up   Here for diabetes f/u.  Compliant with medication for diabetes and hyperlipidemia.  No particular c/o.  Checks sugars regularly.  Getting glucose 110s - 150s regularly, sometimes higher.   Typically eats 2 times daily sometimes one time daily.  Has 2 stores now, staying very busy.  Owns convenient stores.  Denies polyuria, polydipsia, no vision changes, no hypoglycemia.  No other aggravating or relieving factors. No other complaint.  Past Medical History:  Diagnosis Date  . Chronic constipation   . Diabetes mellitus without complication ([place])   . Smoker    Current Outpatient Prescriptions on File Prior to Visit  Medication Sig Dispense Refill  . BAYER CONTOUR NEXT TEST test strip USE TO TEST BLOOD SUGAR DAILY 100 each 1  . blood glucose meter kit and supplies KIT Dispense based on patient and insurance preference. Test once daily 1 each 0   No current facility-administered medications on file prior to visit.    ROS as in subjective    Objective: BP 120/80   Pulse 88   Wt 126 lb 12.8 oz (57.5 kg)   BMI 19.57 kg/m   BP Readings from Last 3 Encounters:  08/08/16 120/80  02/20/16 (!) 142/70  06/02/15 112/80   Wt Readings from Last 3 Encounters:  08/08/16 126 lb 12.8 oz (57.5 kg)  02/20/16 137 lb 8 oz (62.4 kg)  06/02/15 131 lb (59.4 kg)   General appearance: alert, no distress, WD/WN Neck: supple, no lymphadenopathy, no thyromegaly, no masses Heart: RRR, normal S1, S2, no murmurs Lungs: CTA bilaterally, no wheezes, rhonchi, or rales Abdomen: +bs, soft, non tender, non distended, no masses, no hepatomegaly, no splenomegaly Pulses: 2+ symmetric, upper and lower extremities, normal cap refill Ext: no edema, left lower anterior leg moderate varicosities    Assessment: Encounter Diagnoses  Name Primary?  . Diabetes mellitus with complication ([place]) Yes  . Hyperlipidemia,  unspecified hyperlipidemia type   . Smoker   . Varicose vein of leg      Plan: Diabetes - Medications: c/t same medication Education today included:  blood sugar goals, complications of diabetes mellitus, hypoglycemia prevention and treatment, exercise, self-monitoring of blood glucose skills and nutrition Advised daily foot checks, yearly diabetic eye exams, dental hygiene Compliance at present is estimated to be good. follow up: I recommend diabetes care be 3 months.  Hyperlipidemia - return within next few days for fasting labs, c/t same medication started last visit  Smoker - advised he stop tobacco  Varicose veins - script today for Ted hose, advised exercise, leg elevation  Deovion was seen today for diabetes follow-up.  Diagnoses and all orders for this visit:  Diabetes mellitus with complication ([place]) -     Comprehensive metabolic panel; Future -     CBC; Future -     Microalbumin / creatinine urine ratio; Future  Hyperlipidemia, unspecified hyperlipidemia type -     Lipid panel; Future  Smoker -     CBC; Future  Varicose vein of leg -     Comprehensive metabolic panel; Future -     CBC; Future  Other orders -     pravastatin (PRAVACHOL) 20 MG tablet; Take 1 tablet (20 mg total) by mouth daily. -     Empagliflozin-Metformin HCl (SYNJARDY) 12.08-998 MG TABS; Take 1 tablet by mouth 2 (two) times daily.

## 2016-08-13 ENCOUNTER — Other Ambulatory Visit: Payer: Self-pay | Admitting: Medical

## 2016-08-13 ENCOUNTER — Other Ambulatory Visit: Payer: BLUE CROSS/BLUE SHIELD

## 2016-08-13 DIAGNOSIS — E118 Type 2 diabetes mellitus with unspecified complications: Secondary | ICD-10-CM

## 2016-08-13 DIAGNOSIS — I8390 Asymptomatic varicose veins of unspecified lower extremity: Secondary | ICD-10-CM

## 2016-08-13 DIAGNOSIS — F17200 Nicotine dependence, unspecified, uncomplicated: Secondary | ICD-10-CM

## 2016-08-13 DIAGNOSIS — E785 Hyperlipidemia, unspecified: Secondary | ICD-10-CM

## 2016-08-13 LAB — LIPID PANEL
Cholesterol: 116 mg/dL (ref <200)
HDL: 38 mg/dL — ABNORMAL LOW (ref >40)
LDL CALC: 63 mg/dL (ref <100)
TRIGLYCERIDES: 77 mg/dL (ref <150)
Total CHOL/HDL Ratio: 3.1 Ratio (ref <5.0)
VLDL: 15 mg/dL (ref <30)

## 2016-08-13 LAB — COMPREHENSIVE METABOLIC PANEL
ALT: 21 U/L (ref 9–46)
AST: 18 U/L (ref 10–40)
Albumin: 4.4 g/dL (ref 3.6–5.1)
Alkaline Phosphatase: 85 U/L (ref 40–115)
BUN: 15 mg/dL (ref 7–25)
CHLORIDE: 105 mmol/L (ref 98–110)
CO2: 27 mmol/L (ref 20–31)
Calcium: 9.6 mg/dL (ref 8.6–10.3)
Creat: 0.85 mg/dL (ref 0.60–1.35)
GLUCOSE: 139 mg/dL — AB (ref 65–99)
POTASSIUM: 4.4 mmol/L (ref 3.5–5.3)
SODIUM: 141 mmol/L (ref 135–146)
Total Bilirubin: 1.1 mg/dL (ref 0.2–1.2)
Total Protein: 7.5 g/dL (ref 6.1–8.1)

## 2016-08-13 LAB — CBC
HCT: 43.6 % (ref 38.5–50.0)
HEMOGLOBIN: 15 g/dL (ref 13.2–17.1)
MCH: 28.9 pg (ref 27.0–33.0)
MCHC: 34.4 g/dL (ref 32.0–36.0)
MCV: 84 fL (ref 80.0–100.0)
MPV: 11 fL (ref 7.5–12.5)
Platelets: 169 10*3/uL (ref 140–400)
RBC: 5.19 MIL/uL (ref 4.20–5.80)
RDW: 14.5 % (ref 11.0–15.0)
WBC: 11.1 10*3/uL — AB (ref 4.0–10.5)

## 2016-08-14 ENCOUNTER — Other Ambulatory Visit: Payer: Self-pay | Admitting: Medical

## 2016-08-14 LAB — MICROALBUMIN / CREATININE URINE RATIO
CREATININE, URINE: 59 mg/dL (ref 20–370)
MICROALB UR: 0.6 mg/dL
MICROALB/CREAT RATIO: 10 ug/mg{creat} (ref <30)

## 2016-08-16 ENCOUNTER — Other Ambulatory Visit: Payer: Self-pay | Admitting: Medical

## 2016-08-16 LAB — HEMOGLOBIN A1C
HEMOGLOBIN A1C: 7.9 % — AB (ref <5.7)
Mean Plasma Glucose: 180 mg/dL

## 2016-08-16 MED ORDER — PRAVASTATIN SODIUM 20 MG PO TABS: 20.0000 mg | ORAL_TABLET | Freq: Every day | ORAL | 1 refills | Status: DC

## 2016-08-16 MED ORDER — EMPAGLIFLOZIN-METFORMIN HCL 12.5-1000 MG PO TABS: 1.0000 | ORAL_TABLET | Freq: Two times a day (BID) | ORAL | 1 refills | Status: DC

## 2016-08-16 MED ORDER — ASPIRIN EC 81 MG PO TBEC: 81.0000 mg | DELAYED_RELEASE_TABLET | Freq: Every day | ORAL | 3 refills | Status: DC

## 2016-11-07 DIAGNOSIS — J189 Pneumonia, unspecified organism: Secondary | ICD-10-CM

## 2016-11-07 HISTORY — DX: Pneumonia, unspecified organism: J18.9

## 2016-11-14 ENCOUNTER — Ambulatory Visit (INDEPENDENT_AMBULATORY_CARE_PROVIDER_SITE_OTHER): Payer: Self-pay | Admitting: Medical

## 2016-11-14 ENCOUNTER — Other Ambulatory Visit: Payer: Self-pay | Admitting: Medical

## 2016-11-14 VITALS — BP 108/68 | HR 78 | Wt 131.0 lb

## 2016-11-14 DIAGNOSIS — E785 Hyperlipidemia, unspecified: Secondary | ICD-10-CM

## 2016-11-14 DIAGNOSIS — Z0000 Encounter for general adult medical examination without abnormal findings: Secondary | ICD-10-CM | POA: Insufficient documentation

## 2016-11-14 DIAGNOSIS — N529 Male erectile dysfunction, unspecified: Secondary | ICD-10-CM | POA: Insufficient documentation

## 2016-11-14 DIAGNOSIS — F1290 Cannabis use, unspecified, uncomplicated: Secondary | ICD-10-CM

## 2016-11-14 DIAGNOSIS — E118 Type 2 diabetes mellitus with unspecified complications: Secondary | ICD-10-CM

## 2016-11-14 DIAGNOSIS — Z125 Encounter for screening for malignant neoplasm of prostate: Secondary | ICD-10-CM

## 2016-11-14 DIAGNOSIS — R030 Elevated blood-pressure reading, without diagnosis of hypertension: Secondary | ICD-10-CM

## 2016-11-14 DIAGNOSIS — F17200 Nicotine dependence, unspecified, uncomplicated: Secondary | ICD-10-CM

## 2016-11-14 DIAGNOSIS — R6882 Decreased libido: Secondary | ICD-10-CM | POA: Insufficient documentation

## 2016-11-14 NOTE — Progress Notes (Signed)
Subjective: Chief Complaint  Patient presents with  . Diabetes   Here for diabetes f/u.  Compliant with medication for diabetes and hyperlipidemia, Synjardy, Pravachol and ASA.  Checks sugars regularly.  Getting glucose 130-160s regularly, sometimes higher.   Typically eats 3 times daily sometimes one time daily.  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   He reports [place], he smokes Ganja at times/marijuana, and states when he smokes this his sugars are much better.   He is curious about using marijuana here either in smoked marijuana or marijuana drops.   Denies polyuria, no vision changes, no hypoglycemia.  No other aggravating or relieving factors. No other complaint.  Past Medical History:  Diagnosis Date  . Chronic constipation   . Diabetes mellitus without complication ([facility])   . Smoker    Current Outpatient Prescriptions on File Prior to Visit  Medication Sig Dispense Refill  . aspirin EC 81 MG tablet Take 1 tablet (81 mg total) by mouth daily. 90 tablet 3  . BAYER CONTOUR NEXT TEST test strip USE TO TEST BLOOD SUGAR DAILY 100 each 1  . blood glucose meter kit and supplies KIT Dispense based on patient and insurance preference. Test once daily 1 each 0  . Empagliflozin-Metformin HCl (SYNJARDY) 12.08-998 MG TABS Take 1 tablet by mouth 2 (two) times daily. 180 tablet 1  . pravastatin (PRAVACHOL) 20 MG tablet Take 1 tablet (20 mg total) by mouth daily. 90 tablet 1   No current facility-administered medications on file prior to visit.    ROS as in subjective    Objective: There were no vitals taken for this visit.  BP Readings from Last 3 Encounters:  08/08/16 120/80  02/20/16 (!) 142/70  06/02/15 112/80   Wt Readings from Last 3 Encounters:  08/08/16 126 lb 12.8 oz (57.5 kg)  02/20/16 137 lb 8 oz (62.4 kg)  06/02/15 131 lb (59.4 kg)   General appearance: alert, no distress, WD/WN Neck: supple,  no lymphadenopathy, no thyromegaly, no masses Heart: RRR, normal S1, S2, no murmurs Lungs: CTA bilaterally, no wheezes, rhonchi, or rales Abdomen: +bs, soft, non tender, non distended, no masses, no hepatomegaly, no splenomegaly Pulses: 2+ symmetric, upper and lower extremities, normal cap refill Ext: no edema, left lower anterior leg moderate varicosities    Assessment: Encounter Diagnoses  Name Primary?  . Diabetes mellitus with complication ([place]) Yes  . Hyperlipidemia, unspecified hyperlipidemia type   . Smoker   . Elevated blood-pressure reading without diagnosis of hypertension   . Low libido   . Erectile dysfunction, unspecified erectile dysfunction type   . Screening for prostate cancer   . Marijuana use      Plan: Diabetes - labs today.  I suspect HgbA1C not at goal given somewhat elevated readings.  He is doing ok on Synjardy, but is very reluctant on using injectable medication.   I counseled on diet, exercise, and advised he measure out his rice which he is not doing.   Education today included:  blood sugar goals, complications of diabetes mellitus, hypoglycemia prevention and treatment, exercise, self-monitoring of blood glucose skills and nutrition Advised daily foot checks, yearly diabetic eye exams, dental hygiene  Hyperlipidemia - compliant with medication.  ED - labs today for TST  PSA screen today with lab  I will get back with him in regards to marijuana use, but advised for now to abstain.  Discussed risks of  black market marijuana.  Gordon Perez was seen today for diabetes.  Diagnoses and all orders for this visit:  Diabetes mellitus with complication ([place]) -     Comprehensive metabolic panel -     Hemoglobin A1c  Hyperlipidemia, unspecified hyperlipidemia type -     Comprehensive metabolic panel  Smoker  Elevated blood-pressure reading without diagnosis of hypertension  Low libido -     Testosterone  Erectile dysfunction, unspecified erectile  dysfunction type -     Testosterone  Screening for prostate cancer -     PSA  Marijuana use

## 2016-11-15 ENCOUNTER — Other Ambulatory Visit: Payer: Self-pay | Admitting: Medical

## 2016-11-15 ENCOUNTER — Encounter: Payer: Self-pay | Admitting: Medical

## 2016-11-15 LAB — TESTOSTERONE: Testosterone: 336 ng/dL (ref 250–827)

## 2016-11-15 LAB — HEMOGLOBIN A1C
Hgb A1c MFr Bld: 8.1 % — ABNORMAL HIGH (ref <5.7)
MEAN PLASMA GLUCOSE: 186 mg/dL

## 2016-11-15 LAB — COMPREHENSIVE METABOLIC PANEL
ALBUMIN: 4.4 g/dL (ref 3.6–5.1)
ALT: 16 U/L (ref 9–46)
AST: 15 U/L (ref 10–40)
Alkaline Phosphatase: 94 U/L (ref 40–115)
BILIRUBIN TOTAL: 0.8 mg/dL (ref 0.2–1.2)
BUN: 15 mg/dL (ref 7–25)
CO2: 22 mmol/L (ref 20–32)
CREATININE: 0.88 mg/dL (ref 0.60–1.35)
Calcium: 9.8 mg/dL (ref 8.6–10.3)
Chloride: 102 mmol/L (ref 98–110)
GLUCOSE: 116 mg/dL — AB (ref 65–99)
Potassium: 4.4 mmol/L (ref 3.5–5.3)
SODIUM: 138 mmol/L (ref 135–146)
Total Protein: 7.5 g/dL (ref 6.1–8.1)

## 2016-11-15 LAB — PSA: PSA: 0.8 ng/mL (ref <=4.0)

## 2016-11-15 MED ORDER — PRAVASTATIN SODIUM 20 MG PO TABS: 20.0000 mg | ORAL_TABLET | Freq: Every day | ORAL | 1 refills | Status: DC

## 2016-11-15 MED ORDER — LINAGLIPTIN 5 MG PO TABS: 5.0000 mg | ORAL_TABLET | Freq: Every day | ORAL | 0 refills | Status: DC

## 2016-11-15 MED ORDER — EMPAGLIFLOZIN-METFORMIN HCL 12.5-1000 MG PO TABS: 1.0000 | ORAL_TABLET | Freq: Two times a day (BID) | ORAL | 1 refills | Status: DC

## 2016-12-26 ENCOUNTER — Ambulatory Visit (INDEPENDENT_AMBULATORY_CARE_PROVIDER_SITE_OTHER): Payer: BLUE CROSS/BLUE SHIELD | Admitting: Medical

## 2016-12-26 ENCOUNTER — Encounter: Payer: Self-pay | Admitting: Medical

## 2016-12-26 VITALS — BP 112/70 | HR 121 | Temp 98.4°F | Resp 16 | Wt 127.4 lb

## 2016-12-26 DIAGNOSIS — R634 Abnormal weight loss: Secondary | ICD-10-CM | POA: Diagnosis not present

## 2016-12-26 DIAGNOSIS — R05 Cough: Secondary | ICD-10-CM

## 2016-12-26 DIAGNOSIS — R000 Tachycardia, unspecified: Secondary | ICD-10-CM | POA: Diagnosis not present

## 2016-12-26 DIAGNOSIS — R509 Fever, unspecified: Secondary | ICD-10-CM | POA: Diagnosis not present

## 2016-12-26 DIAGNOSIS — R059 Cough, unspecified: Secondary | ICD-10-CM

## 2016-12-26 DIAGNOSIS — R6883 Chills (without fever): Secondary | ICD-10-CM

## 2016-12-26 MED ORDER — AZITHROMYCIN 250 MG PO TABS: ORAL_TABLET | ORAL | 0 refills | Status: DC

## 2016-12-26 NOTE — Progress Notes (Signed)
Subjective: Chief Complaint  Patient presents with  . cough    cough for 3 or 4 days. getting worse    Gordon Perez is a 46 y.o. male who presents for cough x 4 days, productive ,worsening, with brown sputum, body aches, chills, but no NVD.   Denies headache, sneezing and hemoptysis. Using nothing for symptoms. denies sick contacts.  Past history is significant for diabetes.   Patient is a smoker. He also reports weight loss in recent month.  Denies lymph nodes swollen, no bleeding or bruising.   No other aggravating or relieving factors.  No other c/o.  The following portions of the patient's history were reviewed and updated as appropriate: allergies, current medications, past family history, past medical history, past social history, past surgical history and problem list.  ROS as in subjective  Past Medical History:  Diagnosis Date  . Chronic constipation   . Diabetes mellitus without complication ([place])   . Smoker    Current Outpatient Prescriptions on File Prior to Visit  Medication Sig Dispense Refill  . aspirin EC 81 MG tablet Take 1 tablet (81 mg total) by mouth daily. 90 tablet 3  . BAYER CONTOUR NEXT TEST test strip USE TO TEST BLOOD SUGAR DAILY 100 each 1  . blood glucose meter kit and supplies KIT Dispense based on patient and insurance preference. Test once daily 1 each 0  . Empagliflozin-Metformin HCl (SYNJARDY) 12.08-998 MG TABS Take 1 tablet by mouth 2 (two) times daily. 180 tablet 1  . linagliptin (TRADJENTA) 5 MG TABS tablet Take 1 tablet (5 mg total) by mouth daily. 90 tablet 0  . pravastatin (PRAVACHOL) 20 MG tablet Take 1 tablet (20 mg total) by mouth daily. 90 tablet 1   No current facility-administered medications on file prior to visit.    ROS as in subjective   Objective: BP 112/70   Pulse (!) 121   Temp 98.4 F (36.9 C) (Oral)   Resp 16   Wt 127 lb 6.4 oz (57.8 kg)   SpO2 98%   BMI 19.66 kg/m   General appearance: Alert, WD/WN, no distress,  somewhat ill appearing                             Skin: warm, no rash, no diaphoresis                           Head: no sinus tenderness                            Eyes: conjunctiva normal, corneas clear, PERRLA                            Ears: pearly TMs, external ear canals normal                          Nose: septum midline, turbinates swollen, with erythema and clear discharge             Mouth/throat: MMM, tongue normal, mild pharyngeal erythema                           Neck: supple, shoddy tender anterior adenopathy, no thyromegaly, nontender  Heart: RRR, normal S1, S2, no murmurs                         Lungs: right mid to upper fields with dullness, decreased breath sounds in general, +scattered rhonchi, no wheezes, no rales                Extremities: no edema, nontender      Assessment: Encounter Diagnoses  Name Primary?  . Cough Yes  . Fever, unspecified fever cause   . Chills   . Tachycardia   . Weight loss      Plan:  Advised he go for CXR.   He declines, will possibly go tomorrow.   Begin antibiotic, rest, hydration.   He will return this month as scheduled for physical.     Regarding weight loss - his weight are stable here, but he feels a change.   Consider labs and other imaging at his upcoming physical depending upon chest xray and current symptoms.  Suggested symptomatic OTC remedies for cough and congestion.  Tylenol or Ibuprofen OTC for fever and malaise.  Call/return in 2-3 days if symptoms are worse or not improving.    AF/u pending xray  [name] was seen today for cough.  Diagnoses and all orders for this visit:  Cough -     DG Chest 2 View; Future  Fever, unspecified fever cause -     DG Chest 2 View; Future  Chills -     DG Chest 2 View; Future  Tachycardia -     DG Chest 2 View; Future  Weight loss  Other orders -     azithromycin (ZITHROMAX) 250 MG tablet; 2 tablets day 1, then 1 tablet days 2-4

## 2016-12-27 ENCOUNTER — Ambulatory Visit
Admission: RE | Admit: 2016-12-27 | Discharge: 2016-12-27 | Disposition: A | Discharge status: Home / Self Care | Payer: BLUE CROSS/BLUE SHIELD | Source: Ambulatory Visit | Attending: Medical | Admitting: Medical

## 2016-12-27 DIAGNOSIS — R05 Cough: Secondary | ICD-10-CM

## 2016-12-27 DIAGNOSIS — R6883 Chills (without fever): Secondary | ICD-10-CM

## 2016-12-27 DIAGNOSIS — R509 Fever, unspecified: Secondary | ICD-10-CM

## 2016-12-27 DIAGNOSIS — R059 Cough, unspecified: Secondary | ICD-10-CM

## 2016-12-27 DIAGNOSIS — R000 Tachycardia, unspecified: Secondary | ICD-10-CM

## 2016-12-28 ENCOUNTER — Other Ambulatory Visit: Payer: Self-pay | Admitting: Medical

## 2016-12-28 DIAGNOSIS — R918 Other nonspecific abnormal finding of lung field: Secondary | ICD-10-CM

## 2017-01-07 HISTORY — PX: NO PAST SURGERIES: SHX2092

## 2017-01-15 ENCOUNTER — Ambulatory Visit (INDEPENDENT_AMBULATORY_CARE_PROVIDER_SITE_OTHER): Payer: BLUE CROSS/BLUE SHIELD | Admitting: Medical

## 2017-01-15 ENCOUNTER — Encounter: Payer: Self-pay | Admitting: Medical

## 2017-01-15 VITALS — BP 118/72 | HR 68 | Ht 68.0 in | Wt 129.2 lb

## 2017-01-15 DIAGNOSIS — R05 Cough: Secondary | ICD-10-CM

## 2017-01-15 DIAGNOSIS — R9389 Abnormal findings on diagnostic imaging of other specified body structures: Secondary | ICD-10-CM | POA: Diagnosis not present

## 2017-01-15 DIAGNOSIS — E785 Hyperlipidemia, unspecified: Secondary | ICD-10-CM

## 2017-01-15 DIAGNOSIS — Z23 Encounter for immunization: Secondary | ICD-10-CM

## 2017-01-15 DIAGNOSIS — E118 Type 2 diabetes mellitus with unspecified complications: Secondary | ICD-10-CM | POA: Diagnosis not present

## 2017-01-15 DIAGNOSIS — R059 Cough, unspecified: Secondary | ICD-10-CM | POA: Insufficient documentation

## 2017-01-15 DIAGNOSIS — N529 Male erectile dysfunction, unspecified: Secondary | ICD-10-CM | POA: Diagnosis not present

## 2017-01-15 DIAGNOSIS — Z136 Encounter for screening for cardiovascular disorders: Secondary | ICD-10-CM | POA: Diagnosis not present

## 2017-01-15 DIAGNOSIS — R6882 Decreased libido: Secondary | ICD-10-CM | POA: Diagnosis not present

## 2017-01-15 DIAGNOSIS — I8393 Asymptomatic varicose veins of bilateral lower extremities: Secondary | ICD-10-CM | POA: Diagnosis not present

## 2017-01-15 DIAGNOSIS — R7989 Other specified abnormal findings of blood chemistry: Secondary | ICD-10-CM | POA: Diagnosis not present

## 2017-01-15 DIAGNOSIS — F17200 Nicotine dependence, unspecified, uncomplicated: Secondary | ICD-10-CM

## 2017-01-15 DIAGNOSIS — Z0000 Encounter for general adult medical examination without abnormal findings: Secondary | ICD-10-CM

## 2017-01-15 LAB — POCT URINALYSIS DIP (PROADVANTAGE DEVICE)
Bilirubin, UA: NEGATIVE
Blood, UA: NEGATIVE
Glucose, UA: NEGATIVE mg/dL
Ketones, POC UA: NEGATIVE mg/dL
LEUKOCYTES UA: NEGATIVE
Nitrite, UA: NEGATIVE
PH UA: 6 (ref 5.0–8.0)
Protein Ur, POC: NEGATIVE mg/dL
SPECIFIC GRAVITY, URINE: 1.015
UUROB: NEGATIVE

## 2017-01-15 NOTE — Patient Instructions (Signed)
Encounter Diagnoses  Name Primary?  . Routine general medical examination at a health care facility Yes  . Cough   . Abnormal chest x-ray   . Smoker   . Varicose veins of both lower extremities, unspecified whether complicated   . Diabetes mellitus with complication ([facility])   . Erectile dysfunction, unspecified erectile dysfunction type   . Hyperlipidemia, unspecified hyperlipidemia type   . Low libido   . Need for prophylactic vaccination and inoculation against influenza   . Need for Tdap vaccination   . Screening for heart disease   . Low testosterone     Recommendations: See your eye doctor yearly for routine vision care.  Here are some local eye doctors: Dr. Glenford Peers [address] [place], [place] [address] [phone]   [facility] Dr. Gelene Mink [address], [place] [place], [place] [address]  [phone] [email or web address]   Vincenza Hews, M.D. Susanne Greenhouse, O.D. [address] [place], [place] [address] Medical telephone: [phone] Optical telephone: [phone]  See your dentist yearly for routine dental care including hygiene visits twice yearly.  Dr. Yancey Flemings, dentist [address] [place], [place] [address] [phone] [email or web address]   Exercise with walking regularly  I strongly recommend you quit smoking  I am glad you are using the compression hose on your legs to help improve blood flow in the varicose veins  Continue your current medications  Go for CT scan of chest as planned  We updated your flu shot today.  I recommend this yearly for you and your whole family  We also updated your Tdap tetanus diptheria and pertussis vaccine today  Your testosterone male hormone was low.   We sometimes offer treatment for this to improve sexual function, desire for sex and improve energy level.  If you want to consider treatment, we would need to do some additional lab work.   Let me know

## 2017-01-15 NOTE — Addendum Note (Signed)
Addended by: [place] on: 01/15/2017 12:11 PM   Modules accepted: Orders

## 2017-01-15 NOTE — Progress Notes (Signed)
Subjective:   HPI  Gordon Perez is a 46 y.o. male who presents for a complete physical.  Accompanied by wife and his 8 mo son. Chief Complaint  Patient presents with  . Annual Exam    physical, still have a little cough    Concerns: He is wearing compression hose as we discussed prior for varicose veins.  He continues to smoke  He is still waiting on call back regarding CT scan given history of tobacco use and abnormal CXR  He is still having a little cough but mostly resolved from recent pneumonia.   Reviewed their medical, surgical, family, social, medication, and allergy history and updated chart as appropriate.  Past Medical History:  Diagnosis Date  . Chronic constipation   . Diabetes mellitus without complication ([place])   . Hyperlipidemia   . Pneumonia 11/2016  . Smoker   . Varicose vein of leg     Past Surgical History:  Procedure Laterality Date  . NO PAST SURGERIES  01/2017    Social History   Social History  . Marital status: Married    Spouse name: N/A  . Number of children: N/A  . Years of education: N/A   Occupational History  . Not on file.   Social History Main Topics  . Smoking status: Current Every Day Smoker    Packs/day: 1.00    Years: 20.00  . Smokeless tobacco: Never Used  . Alcohol use No  . Drug use: No  . Sexual activity: Not on file   Other Topics Concern  . Not on file   Social History Narrative   Married, has 3 children, owns gas station, [employer].   Walking/moving at work.   From [place] originally.  01/2017    Family History  Problem Relation Age of Onset  . Other Mother        old age  . Other Father        old age  . Diabetes Brother   . Cancer Neg Hx   . Heart disease Neg Hx   . Stroke Neg Hx      Current Outpatient Prescriptions:  .  BAYER CONTOUR NEXT TEST test strip, USE TO TEST BLOOD SUGAR DAILY, Disp: 100 each, Rfl: 1 .  blood glucose meter kit and supplies KIT, Dispense based on patient and insurance  preference. Test once daily, Disp: 1 each, Rfl: 0 .  Empagliflozin-Metformin HCl (SYNJARDY) 12.08-998 MG TABS, Take 1 tablet by mouth 2 (two) times daily., Disp: 180 tablet, Rfl: 1 .  pravastatin (PRAVACHOL) 20 MG tablet, Take 1 tablet (20 mg total) by mouth daily., Disp: 90 tablet, Rfl: 1  No Known Allergies   Review of Systems Constitutional: -fever, -chills, -sweats, -unexpected weight change, -decreased appetite, -fatigue Allergy: -sneezing, -itching, -congestion Dermatology: -changing moles, --rash, -lumps ENT: -runny nose, -ear pain, -sore throat, -hoarseness, -sinus pain, -teeth pain, - ringing in ears, -hearing loss, -nosebleeds Cardiology: -chest pain, -palpitations, -swelling, -difficulty breathing when lying flat, -waking up short of breath Respiratory: +cough, -shortness of breath, -difficulty breathing with exercise or exertion, -wheezing, -coughing up blood Gastroenterology: -abdominal pain, -nausea, -vomiting, -diarrhea, -constipation, -blood in stool, -changes in bowel movement, -difficulty swallowing or eating Hematology: -bleeding, -bruising  Musculoskeletal: -joint aches, -muscle aches, -joint swelling, -back pain, -neck pain, -cramping, -changes in gait Ophthalmology: denies vision changes, eye redness, itching, discharge Urology: -burning with urination, -difficulty urinating, -blood in urine, -urinary frequency, -urgency, -incontinence Neurology: -headache, -weakness, -tingling, -numbness, -memory loss, -falls, -dizziness  Psychology: -depressed mood, -agitation, -sleep problems     Objective:   Physical Exam  BP 118/72   Pulse 68   Ht '5\' 8"'$  (1.727 m)   Wt 129 lb 3.2 oz (58.6 kg)   SpO2 99%   BMI 19.64 kg/m   General appearance: alert, no distress, WD/WN, [place] male Skin: dry, otherwise no worrisome lesions HEENT: normocephalic, conjunctiva/corneas normal, sclerae anicteric, PERRLA, EOMi, nares patent, no discharge or erythema, pharynx normal Oral  cavity: MMM, tongue normal, teeth - lots of stain, left upper molar with decay Neck: supple, no lymphadenopathy, no thyromegaly, no masses, normal ROM, no bruits Chest: non tender, normal shape and expansion Heart: RRR, normal S1, S2, no murmurs Lungs: CTA bilaterally, no wheezes, rhonchi, or rales Abdomen: +bs, soft, non tender, non distended, no masses, no hepatomegaly, no splenomegaly, no bruits Back: non tender, normal ROM, no scoliosis Musculoskeletal: upper extremities non tender, no obvious deformity, normal ROM throughout, lower extremities non tender, no obvious deformity, normal ROM throughout Extremities: no edema, no cyanosis, no clubbing Pulses: 2+ symmetric, upper and lower extremities, normal cap refill Neurological: alert, oriented x 3, CN2-12 intact, strength normal upper extremities and lower extremities, sensation normal throughout, DTRs 2+ throughout, no cerebellar signs, gait normal Psychiatric: normal affect, behavior normal, pleasant  GU: normal male external genitalia, uncircumcised, nontender, no masses, no hernia, no lymphadenopathy Rectal: deferred    Adult ECG Report  Indication: heart disease screen, physical  Rate: 71 bpm  Rhythm: normal sinus rhythm  QRS Axis: 68 degrees  PR Interval: 149m  QRS Duration: 955m QTc: 39372mConduction Disturbances: none  Other Abnormalities: none  Patient's cardiac risk factors are: diabetes mellitus, male gender and smoking/ tobacco exposure.  EKG comparison: none  Narrative Interpretation: normal EKG    Assessment and Plan :    Encounter Diagnoses  Name Primary?  . Routine general medical examination at a health care facility Yes  . Cough   . Abnormal chest x-ray   . Smoker   . Varicose veins of both lower extremities, unspecified whether complicated   . Diabetes mellitus with complication ([place] . Erectile dysfunction, unspecified erectile dysfunction type   . Hyperlipidemia, unspecified hyperlipidemia type    . Low libido   . Need for prophylactic vaccination and inoculation against influenza   . Need for Tdap vaccination   . Screening for heart disease   . Low testosterone     Physical exam - discussed healthy lifestyle, diet, exercise, preventative care, vaccinations, and addressed their concerns.   Advised they see a dentist yearly for routine dental care including hygiene visits twice yearly. Advised they see an eye doctor yearly for routine vision care.  Reviewed recent labs from last visit  Vaccinations: Counseled on the Tdap (tetanus, diptheria, and acellular pertussis) vaccine.  Vaccine information sheet given. Tdap vaccine given after consent obtained.  Counseled on the influenza virus vaccine.  Vaccine information sheet given.  Influenza vaccine given after consent obtained.  Varicose veins - c/t compression hose ED, low T - discussed labs, concerns.  He will consider other eval and treatment for low testosterone.  He declines today Diabetes - c/t same medications, glucose monitoring.  F/u [id number] he quit smoking.  He is not ready to quit He will got for CT chest given abnormal xray and smoking history  Follow up 41mo 65modiabetes f/u.   Dwyne was seen today for annual exam.  Diagnoses and all orders for this visit:  Routine  general medical examination at a health care facility -     POCT Urinalysis DIP (Proadvantage Device) -     EKG 12-Lead  Cough -     CT CHEST WO CONTRAST; Future  Abnormal chest x-ray  Smoker  Varicose veins of both lower extremities, unspecified whether complicated  Diabetes mellitus with complication (HCC) -     EKG 12-Lead  Erectile dysfunction, unspecified erectile dysfunction type  Hyperlipidemia, unspecified hyperlipidemia type -     EKG 12-Lead  Low libido  Need for prophylactic vaccination and inoculation against influenza  Need for Tdap vaccination  Screening for heart disease -     EKG 12-Lead  Low  testosterone

## 2017-01-17 ENCOUNTER — Ambulatory Visit
Admission: RE | Admit: 2017-01-17 | Discharge: 2017-01-17 | Disposition: A | Discharge status: Home / Self Care | Payer: BLUE CROSS/BLUE SHIELD | Source: Ambulatory Visit | Attending: Medical | Admitting: Medical

## 2017-01-17 DIAGNOSIS — R05 Cough: Secondary | ICD-10-CM

## 2017-01-17 DIAGNOSIS — R059 Cough, unspecified: Secondary | ICD-10-CM

## 2017-02-01 LAB — HM DIABETES EYE EXAM

## 2017-06-27 ENCOUNTER — Ambulatory Visit: Payer: BLUE CROSS/BLUE SHIELD | Admitting: Medical

## 2017-06-28 ENCOUNTER — Ambulatory Visit: Payer: BLUE CROSS/BLUE SHIELD | Admitting: Medical

## 2017-06-28 ENCOUNTER — Encounter: Payer: Self-pay | Admitting: Medical

## 2017-06-28 VITALS — BP 130/78 | HR 87 | Ht 67.5 in | Wt 133.6 lb

## 2017-06-28 DIAGNOSIS — E118 Type 2 diabetes mellitus with unspecified complications: Secondary | ICD-10-CM | POA: Diagnosis not present

## 2017-06-28 DIAGNOSIS — E785 Hyperlipidemia, unspecified: Secondary | ICD-10-CM | POA: Diagnosis not present

## 2017-06-28 DIAGNOSIS — I8393 Asymptomatic varicose veins of bilateral lower extremities: Secondary | ICD-10-CM

## 2017-06-28 DIAGNOSIS — F17200 Nicotine dependence, unspecified, uncomplicated: Secondary | ICD-10-CM | POA: Diagnosis not present

## 2017-06-28 MED ORDER — LINAGLIPTIN 5 MG PO TABS: 5.0000 mg | ORAL_TABLET | Freq: Every day | ORAL | 1 refills | Status: DC

## 2017-06-28 MED ORDER — EMPAGLIFLOZIN-METFORMIN HCL 12.5-1000 MG PO TABS: 1.0000 | ORAL_TABLET | Freq: Two times a day (BID) | ORAL | 3 refills | Status: DC

## 2017-06-28 MED ORDER — PRAVASTATIN SODIUM 20 MG PO TABS: 20.0000 mg | ORAL_TABLET | Freq: Every day | ORAL | 3 refills | Status: DC

## 2017-06-28 NOTE — Progress Notes (Signed)
Subjective: Chief Complaint  Patient presents with  . Diabetes    needs refill on medication, blood sugars doing okay    Here for med check.  nonfasting today.  Was seen in 11/2016 for physical.  He is compliant with Synjardy 1 tablet BID.  He wasn't aware of Tradjenta sent last visit.  Checking glucose and  numbers from 130-150.  No foot lesions.  No diabetic symptoms.  Feeling fine.  Actually has gained some weight  Compliant with Pravachol 49m QHS and aspirin 81 mg at nighttime  He has no new complaints.  He cannot seem to quit smoking though.   Past Medical History:  Diagnosis Date  . Chronic constipation   . Diabetes mellitus without complication ([place]   . Hyperlipidemia   . Pneumonia 11/2016  . Smoker   . Varicose vein of leg    Current Outpatient Medications on File Prior to Visit  Medication Sig Dispense Refill  . BAYER CONTOUR NEXT TEST test strip USE TO TEST BLOOD SUGAR DAILY 100 each 1  . blood glucose meter kit and supplies KIT Dispense based on patient and insurance preference. Test once daily 1 each 0   No current facility-administered medications on file prior to visit.    ROS as in subjective   Objective: BP 130/78 (BP Location: Right Arm, Patient Position: Sitting, Cuff Size: Normal)   Pulse 87   Ht 5' 7.5" (1.715 m)   Wt 133 lb 9.6 oz (60.6 kg)   SpO2 99%   BMI 20.62 kg/m   BP Readings from Last 3 Encounters:  06/28/17 130/78  01/15/17 118/72  12/26/16 112/70   Wt Readings from Last 3 Encounters:  06/28/17 133 lb 9.6 oz (60.6 kg)  01/15/17 129 lb 3.2 oz (58.6 kg)  12/26/16 127 lb 6.4 oz (57.8 kg)   General appearance: alert, no distress, WD/WN,  HEENT: normocephalic, sclerae anicteric, TMs pearly, nares patent, no discharge or erythema, pharynx normal Oral cavity: MMM, no lesions Neck: supple, no lymphadenopathy, no thyromegaly, no masses, no bruits Heart: RRR, normal S1, S2, no murmurs Lungs: CTA bilaterally, no wheezes, rhonchi, or  rales Ext: +varicose veins, mostly left leg, otherwise no edema Pulses: 2+ symmetric, upper and lower extremities, normal cap refill  Diabetic Foot Exam - Simple   Simple Foot Form Diabetic Foot exam was performed with the following findings:  Yes 06/28/2017 12:32 PM  Visual Inspection No deformities, no ulcerations, no other skin breakdown bilaterally:  Yes Sensation Testing Intact to touch and monofilament testing bilaterally:  Yes Pulse Check Posterior Tibialis and Dorsalis pulse intact bilaterally:  Yes Comments      Assessment: Encounter Diagnoses  Name Primary?  . Diabetes mellitus with complication ([place] Yes  . Hyperlipidemia, unspecified hyperlipidemia type   . Varicose veins of both lower extremities, unspecified whether complicated   . Smoker      Plan: Diabetes-hemoglobin A1c improved, continue Synjardy but add Tradjenta.  Continue glucose monitoring, healthy diet, get more exercise.  Hyperlipidemia, prior CT evidence of aortic atherosclerosis-continue current medication Pravachol and aspirin daily at bedtime  Counseled on tobacco cessation.  Advise he begin trial of nicotine patches and try and wean down on tobacco  Next visit we advised him to come in fasting for lipid panel and other labs  Gordon Perez was seen today for diabetes.  Diagnoses and all orders for this visit:  Diabetes mellitus with complication ([facility]  Hyperlipidemia, unspecified hyperlipidemia type  Varicose veins of both lower extremities, unspecified whether complicated  Smoker  Other orders -     Empagliflozin-metFORMIN HCl (SYNJARDY) 12.08-998 MG TABS; Take 1 tablet by mouth 2 (two) times daily. -     pravastatin (PRAVACHOL) 20 MG tablet; Take 1 tablet (20 mg total) by mouth daily. -     linagliptin (TRADJENTA) 5 MG TABS tablet; Take 1 tablet (5 mg total) by mouth daily.

## 2017-07-09 ENCOUNTER — Telehealth: Payer: Self-pay

## 2017-07-09 ENCOUNTER — Other Ambulatory Visit: Payer: Self-pay | Admitting: Medical

## 2017-07-09 MED ORDER — SITAGLIPTIN PHOSPHATE 25 MG PO TABS: 25.0000 mg | ORAL_TABLET | Freq: Every day | ORAL | 3 refills | Status: DC

## 2017-07-09 NOTE — Telephone Encounter (Signed)
I sent Januvia as an alternate to [employer]

## 2017-07-09 NOTE — Telephone Encounter (Signed)
Patient called and advised that the Tradjenta was not covered by insurance and he was unable to pick it up from pharmacy he would like to change to something else. Thank you!

## 2017-07-10 NOTE — Telephone Encounter (Signed)
Noted. Patient was aware that you would send something, was waiting for it to be submitted.

## 2017-09-24 ENCOUNTER — Other Ambulatory Visit: Payer: Self-pay | Admitting: Medical

## 2017-09-25 NOTE — Telephone Encounter (Signed)
Is this ok to refill?  

## 2017-10-18 ENCOUNTER — Ambulatory Visit (HOSPITAL_COMMUNITY)
Admission: EM | Admit: 2017-10-18 | Discharge: 2017-10-18 | Disposition: A | Discharge status: Home / Self Care | Payer: PRIVATE HEALTH INSURANCE | Attending: Family Medicine | Admitting: Family Medicine

## 2017-10-18 ENCOUNTER — Encounter (HOSPITAL_COMMUNITY): Payer: Self-pay

## 2017-10-18 DIAGNOSIS — K047 Periapical abscess without sinus: Secondary | ICD-10-CM | POA: Diagnosis not present

## 2017-10-18 MED ORDER — AMOXICILLIN-POT CLAVULANATE 875-125 MG PO TABS: 1.0000 | ORAL_TABLET | Freq: Two times a day (BID) | ORAL | 0 refills | Status: AC

## 2017-10-18 NOTE — ED Provider Notes (Signed)
[facility]    CSN: [id number] Arrival date & time: 10/18/17  7616     History   Chief Complaint Chief Complaint  Patient presents with  . Dental Pain    HPI Gordon Perez is a 47 y.o. male hx of HLD, DMT2 presenting today for evaluation of a dental abscess. He states he had a loose tooth in his left upper jaw, he pulled the tooth himself approximately 1 week ago.  Beginning 3 days ago he started to have swelling to his left upper jaw.  He states he has had minimal pain.  He has been trying Tylenol, ibuprofen and Aleve, but the swelling continues to increase.  He denies any drainage.  He has a dental appointment next Thursday for follow-up.  He denies fevers.  Still able to eat and drink.  HPI  Past Medical History:  Diagnosis Date  . Chronic constipation   . Diabetes mellitus without complication ([place])   . Hyperlipidemia   . Pneumonia 11/2016  . Smoker   . Varicose vein of leg     Patient Active Problem List   Diagnosis Date Noted  . Cough 01/15/2017  . Abnormal chest x-ray 01/15/2017  . Screening for heart disease 01/15/2017  . Low testosterone 01/15/2017  . Low libido 11/14/2016  . Erectile dysfunction 11/14/2016  . Routine general medical examination at a health care facility 11/14/2016  . Hyperlipidemia 08/08/2016  . Varicose vein of leg 08/08/2016  . Elevated blood-pressure reading without diagnosis of hypertension 02/20/2016  . Noncompliance 02/20/2016  . Chronic constipation 05/24/2015  . Diabetes mellitus with complication ([place]) 07/37/1062  . Encounter for health maintenance examination in adult 05/24/2015  . Smoker 05/24/2015  . Need for prophylactic vaccination against Streptococcus pneumoniae (pneumococcus) 05/24/2015  . Need for prophylactic vaccination and inoculation against influenza 05/24/2015  . Sebaceous cyst 05/24/2015    Past Surgical History:  Procedure Laterality Date  . NO PAST SURGERIES  01/2017       Home Medications     Prior to Admission medications   Medication Sig Start Date End Date Taking? Authorizing Provider  aspirin 81 MG EC tablet TAKE 1 TABLET(81 MG) BY MOUTH DAILY 09/25/17  Yes Tysinger, Camelia Eng, PA-C  BAYER CONTOUR NEXT TEST test strip USE TO TEST BLOOD SUGAR DAILY 06/28/16  Yes Tysinger, Camelia Eng, PA-C  blood glucose meter kit and supplies KIT Dispense based on patient and insurance preference. Test once daily 05/25/15  Yes Tysinger, Camelia Eng, PA-C  Empagliflozin-metFORMIN HCl (SYNJARDY) 12.08-998 MG TABS Take 1 tablet by mouth 2 (two) times daily. 06/28/17  Yes Tysinger, Camelia Eng, PA-C  sitaGLIPtin (JANUVIA) 25 MG tablet Take 1 tablet (25 mg total) by mouth daily. 07/09/17  Yes Tysinger, Camelia Eng, PA-C  amoxicillin-clavulanate (AUGMENTIN) 875-125 MG tablet Take 1 tablet by mouth every 12 (twelve) hours for 10 days. 10/18/17 10/28/17  Shemeika Starzyk C, PA-C  pravastatin (PRAVACHOL) 20 MG tablet Take 1 tablet (20 mg total) by mouth daily. 06/28/17   Tysinger, Camelia Eng, PA-C    Family History Family History  Problem Relation Age of Onset  . Other Mother        old age  . Other Father        old age  . Diabetes Brother   . Cancer Neg Hx   . Heart disease Neg Hx   . Stroke Neg Hx     Social History Social History   Tobacco Use  . Smoking status: Current Every Day  Smoker    Packs/day: 1.00    Years: 20.00    Pack years: 20.00  . Smokeless tobacco: Never Used  Substance Use Topics  . Alcohol use: No  . Drug use: No     Allergies   Patient has no known allergies.   Review of Systems Review of Systems  Constitutional: Negative for activity change, appetite change, fatigue and fever.  HENT: Positive for dental problem and facial swelling. Negative for congestion, sinus pain and trouble swallowing.   Respiratory: Negative for cough and shortness of breath.   Cardiovascular: Negative for chest pain.  Gastrointestinal: Negative for nausea and vomiting.  Musculoskeletal: Negative for  arthralgias and myalgias.  Neurological: Negative for dizziness, light-headedness and headaches.     Physical Exam Triage Vital Signs ED Triage Vitals  Enc Vitals Group     BP 10/18/17 0936 (!) 126/91     Pulse Rate 10/18/17 0936 90     Resp 10/18/17 0936 18     Temp 10/18/17 0936 98.1 F (36.7 C)     Temp Source 10/18/17 0936 Oral     SpO2 10/18/17 0936 98 %     Weight --      Height --      Head Circumference --      Peak Flow --      Pain Score 10/18/17 0939 6     Pain Loc --      Pain Edu? --      Excl. in [place]? --    No data found.  Updated Vital Signs BP (!) 126/91 (BP Location: Left Arm)   Pulse 90   Temp 98.1 F (36.7 C) (Oral)   Resp 18   SpO2 98%   Visual Acuity Right Eye Distance:   Left Eye Distance:   Bilateral Distance:    Right Eye Near:   Left Eye Near:    Bilateral Near:     Physical Exam  Constitutional: He appears well-developed and well-nourished.  HENT:  Head: Normocephalic and atraumatic.  Obvious swelling to left upper jaw gingiva, minimal tenderness to palpation, mild swelling to facial area near the axillary area.  Last molar on left upper jaw missing  Posterior pharynx patent  Eyes: Conjunctivae are normal.  Neck: Neck supple.  Cardiovascular: Normal rate and regular rhythm.  No murmur heard. Pulmonary/Chest: Effort normal and breath sounds normal. No respiratory distress.  Abdominal: He exhibits no distension.  Musculoskeletal: He exhibits no edema.  Neurological: He is alert.  Skin: Skin is warm and dry.  Psychiatric: He has a normal mood and affect.  Nursing note and vitals reviewed.    UC Treatments / Results  Labs (all labs ordered are listed, but only abnormal results are displayed) Labs Reviewed - No data to display  EKG None  Radiology No results found.  Procedures Procedures (including critical care time)  Medications Ordered in UC Medications - No data to display  Initial Impression / Assessment and  Plan / UC Course  I have reviewed the triage vital signs and the nursing notes.  Pertinent labs & imaging results that were available during my care of the patient were reviewed by me and considered in my medical decision making (see chart for details).     Patient appears to have dental abscess, will initiate on Augmentin.  Continue to use Tylenol and ibuprofen for pain.  Follow-up with dentistry on Thursday.  Return if symptoms not improving despite initiation of antibiotic.Discussed strict return precautions. Patient verbalized  understanding and is agreeable with plan.  Final Clinical Impressions(s) / UC Diagnoses   Final diagnoses:  Dental abscess     Discharge Instructions     Follow up with dentistry as planned on thursday  Today we have given you an antibiotic. This should help with pain as any infection is cleared.   For pain please take '600mg'$ -'800mg'$  of Ibuprofen every 8 hours, take with 1000 mg of Tylenol Extra strength every 8 hours. These are safe to take together. Please take with food.   Please return if you start to experience significant swelling of your face, experiencing fever.    ED Prescriptions    Medication Sig Dispense Auth. Provider   amoxicillin-clavulanate (AUGMENTIN) 875-125 MG tablet Take 1 tablet by mouth every 12 (twelve) hours for 10 days. 20 tablet Gwenivere Hiraldo, [place] C, PA-C     Controlled Substance Prescriptions [place] Controlled Substance Registry consulted? Not Applicable   Janith Lima, [place] 10/18/17 1014

## 2017-10-18 NOTE — ED Triage Notes (Signed)
Pt presents with dental pain on the left side and swelling x 3 days.

## 2017-10-18 NOTE — Discharge Instructions (Signed)
Follow up with dentistry as planned on thursday  Today we have given you an antibiotic. This should help with pain as any infection is cleared.   For pain please take 600mg -800mg  of Ibuprofen every 8 hours, take with 1000 mg of Tylenol Extra strength every 8 hours. These are safe to take together. Please take with food.   Please return if you start to experience significant swelling of your face, experiencing fever.

## 2017-12-14 IMAGING — CT CT CHEST W/O CM
3 of 4 series · 17 of 30 positions shown, 19 images · non-contrast
Comparison: 12/27/2016

CLINICAL DATA: Nodule by chest x-ray, right chest pain, cough,
smoker

EXAM:
CT CHEST WITHOUT CONTRAST
TECHNIQUE: Multidetector CT imaging of the chest was performed following the
standard protocol without IV contrast.

[Series 3: chest w/o · axial · non-contrast · 0.70mm/px · z∈[-270,-30]mm · 7 of 130 slices shown, 9 images]
[im 17/130  mediastinal]
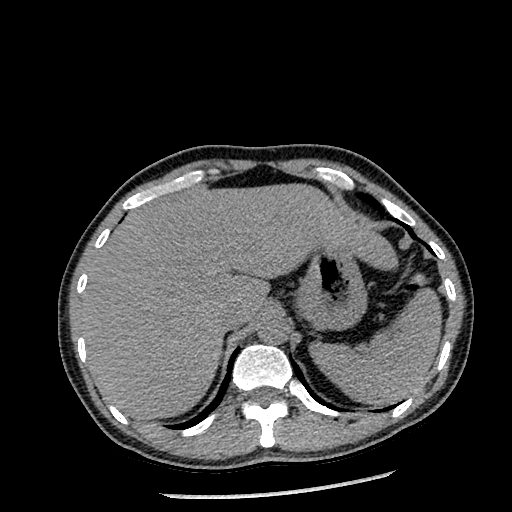
[im 17/130  lung]
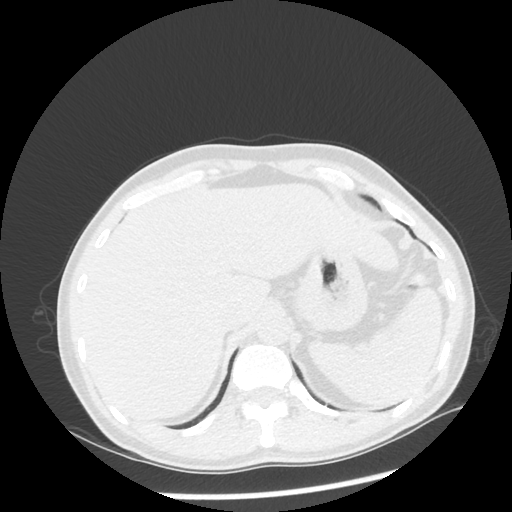
[im 33/130  lung]
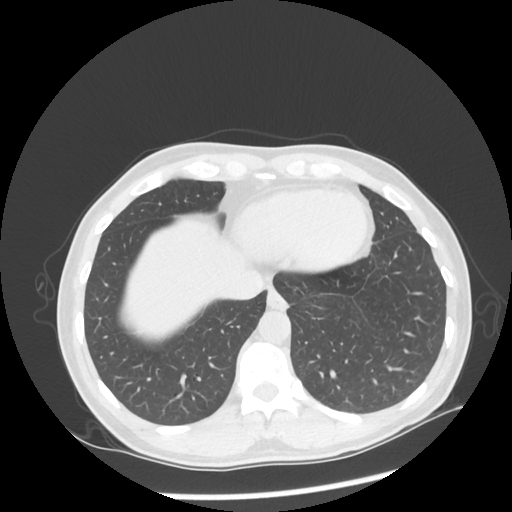
[im 49/130  lung]
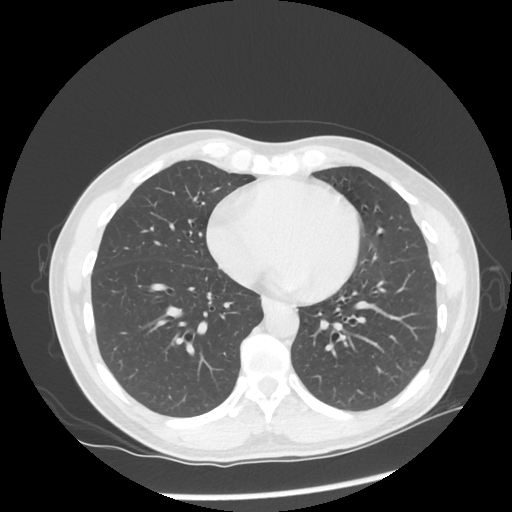
[im 65/130  lung]
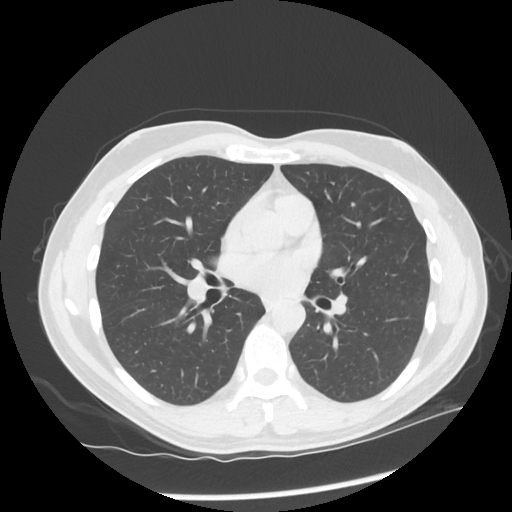
[im 81/130  mediastinal]
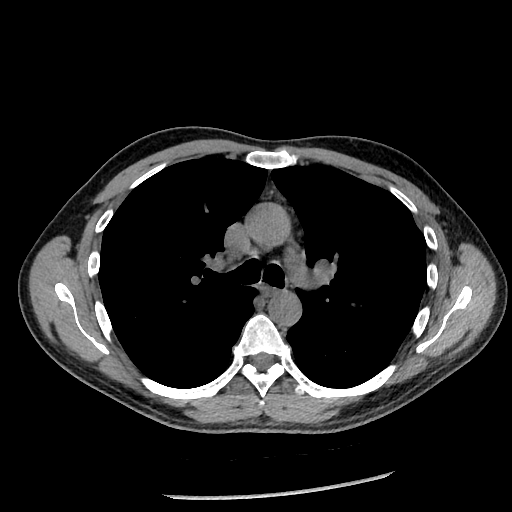
[im 81/130  lung]
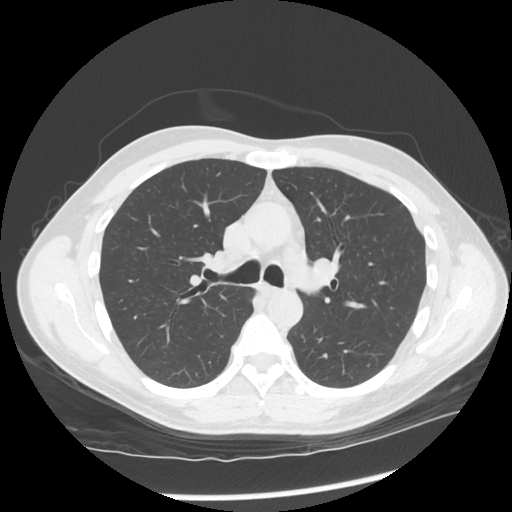
[im 97/130  lung]
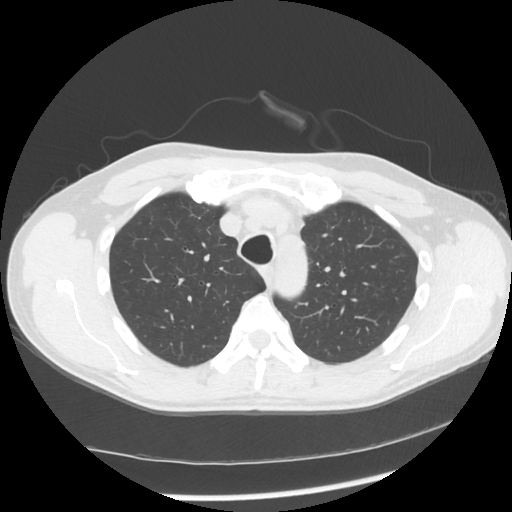
[im 113/130  lung]
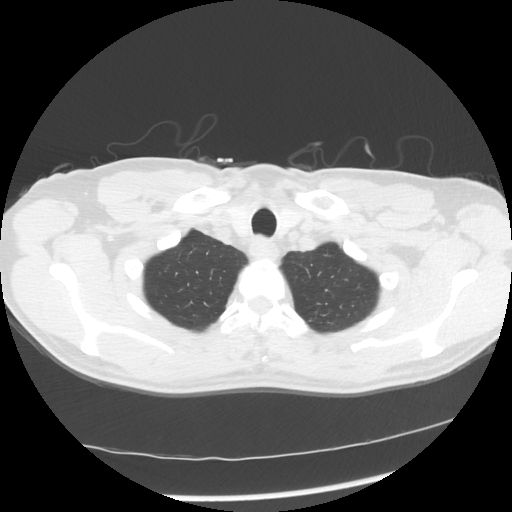

[Series 4: lung windows · axial · 0.70mm/px · z∈[-270,-30]mm · 7 of 130 slices shown]
[im 17/130  lung]
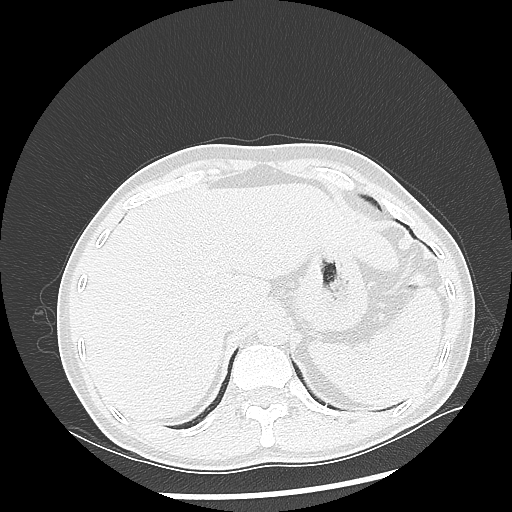
[im 33/130  lung]
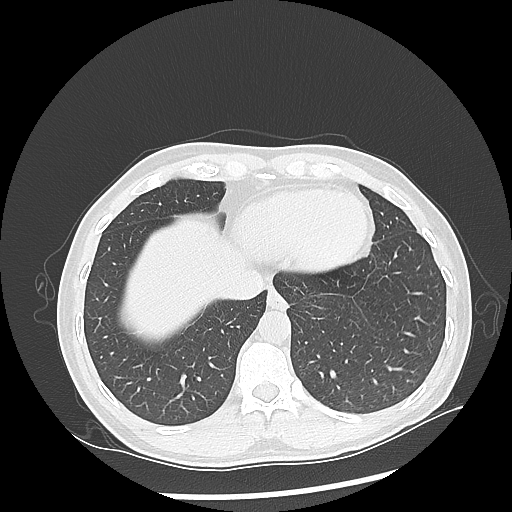
[im 49/130  lung]
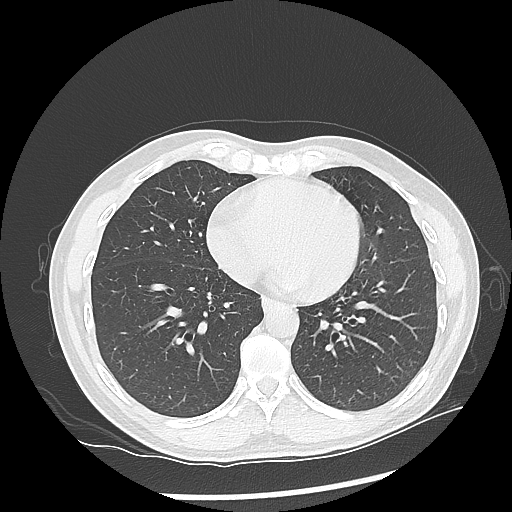
[im 65/130  lung]
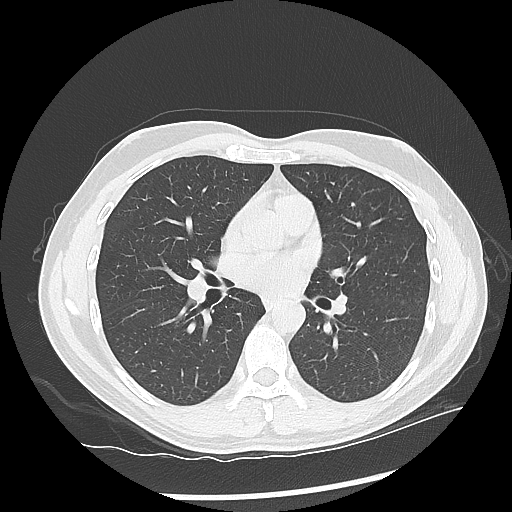
[im 81/130  lung]
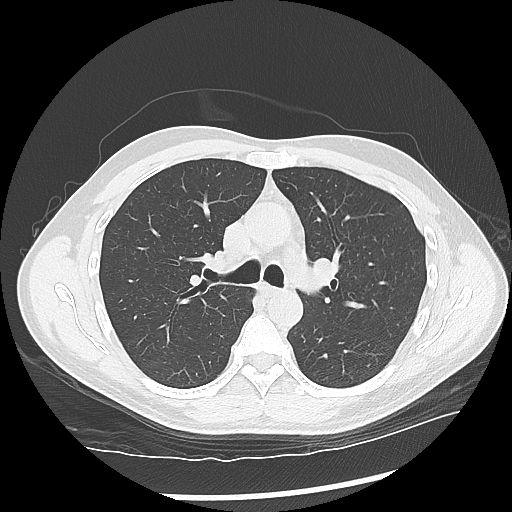
[im 97/130  lung]
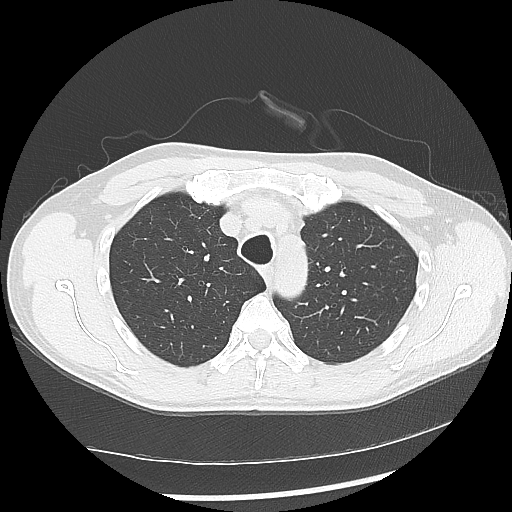
[im 113/130  lung]
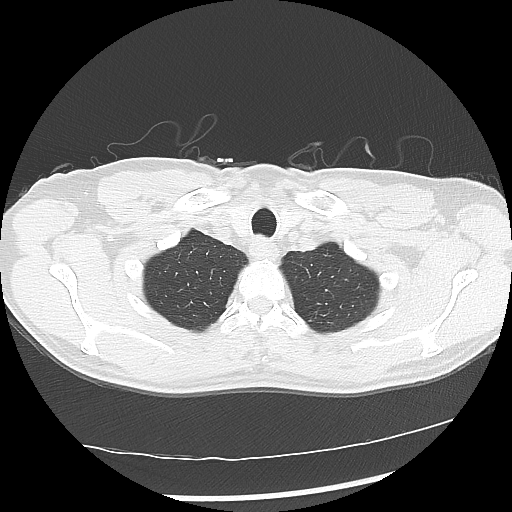

[Series 602: sagittal body · sagittal · 0.70mm/px · 3 of 145 slices shown]
[im 17/145  mediastinal]
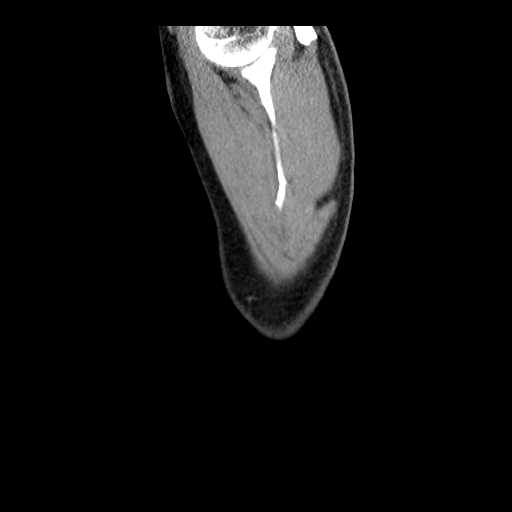
[im 33/145  mediastinal]
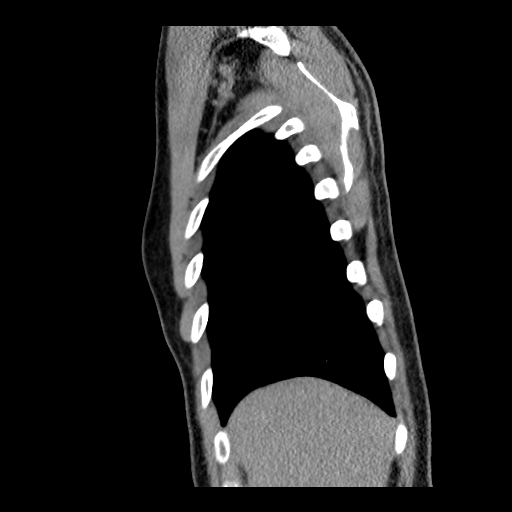
[im 49/145  mediastinal]
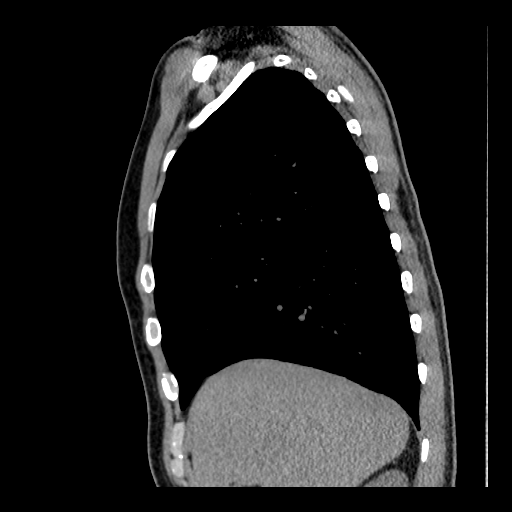

[17 of 30 positions shown; findings below may reference images not displayed]

FINDINGS: Cardiovascular: minor atherosclerosis of the aorta. Three-vessel
arch anatomy. No significant aneurysm. Negative for mediastinal
hemorrhage or hematoma. No pericardial effusion. Normal heart size.

Mediastinum/Nodes: No enlarged mediastinal or axillary lymph nodes.
Thyroid gland, trachea, and esophagus demonstrate no significant
findings.

Lungs/Pleura: Sclerotic nodular bony spurring of the right first
costal manubrial junction correlates with the chest x-ray finding,
images 36 and 37. No other significant underlying suspicion lung
nodule. Lungs remain clear. No interstitial process or edema. No
pleural abnormality, pleural effusion, or pneumothorax. Trachea and
central airways are patent. No bronchial thickening or
bronchiectasis.

Upper Abdomen: No acute abnormality.

Musculoskeletal: No acute osseous finding.  Intact sternum.
IMPRESSION: No suspicious pulmonary nodule or mass. Chest x-ray abnormality
correlates with a sclerotic nodular bone spur of the right first
costal manubrial junction.

No other acute intrathoracic finding.

Aortic Atherosclerosis ([id number]-[id number].L).

## 2018-03-05 ENCOUNTER — Ambulatory Visit: Payer: BLUE CROSS/BLUE SHIELD | Admitting: Medical

## 2018-03-17 ENCOUNTER — Encounter: Payer: Self-pay | Admitting: Medical

## 2018-03-17 ENCOUNTER — Ambulatory Visit: Payer: BLUE CROSS/BLUE SHIELD | Admitting: Medical

## 2018-03-17 VITALS — BP 130/80 | HR 78 | Temp 97.8°F | Resp 16 | Ht 67.5 in | Wt 127.4 lb

## 2018-03-17 DIAGNOSIS — F17200 Nicotine dependence, unspecified, uncomplicated: Secondary | ICD-10-CM | POA: Diagnosis not present

## 2018-03-17 DIAGNOSIS — E785 Hyperlipidemia, unspecified: Secondary | ICD-10-CM | POA: Diagnosis not present

## 2018-03-17 DIAGNOSIS — E118 Type 2 diabetes mellitus with unspecified complications: Secondary | ICD-10-CM | POA: Diagnosis not present

## 2018-03-17 MED ORDER — PRAVASTATIN SODIUM 20 MG PO TABS: 20.0000 mg | ORAL_TABLET | Freq: Every day | ORAL | 0 refills | Status: AC

## 2018-03-17 MED ORDER — EMPAGLIFLOZIN-METFORMIN HCL 12.5-1000 MG PO TABS: 1.0000 | ORAL_TABLET | Freq: Two times a day (BID) | ORAL | 0 refills | Status: AC

## 2018-03-17 MED ORDER — SITAGLIPTIN PHOSPHATE 25 MG PO TABS: 25.0000 mg | ORAL_TABLET | Freq: Every day | ORAL | 0 refills | Status: AC

## 2018-03-17 MED ORDER — ASPIRIN 81 MG PO TBEC: DELAYED_RELEASE_TABLET | ORAL | 3 refills | Status: AC

## 2018-03-17 NOTE — Patient Instructions (Addendum)
Encounter Diagnoses  Name Primary?  . Diabetes mellitus with complication (HCC) Yes  . Smoker   . Hyperlipidemia, unspecified hyperlipidemia type     Your current medications include:  High Cholesterol  Pravachol 20mg  daily at bedtime for cholesterol and atherosclerosis which is plaque buildup in your heart arteries  Diabetes:  Januvia 25mg  daily in the morning  Synjardy 12.5/1000mg  twice daily  Check your sugars several morning per week  Goal is <130 fasting in the morning    Exercise at least 150 minutes per week such as walking, running, or cycling for example  Return soon fasting in the morning for labs

## 2018-03-17 NOTE — Progress Notes (Signed)
Subjective: Chief Complaint  Patient presents with  . follow up DM    follow up DM    Here for med check.  He is accompanied by his sons, second grader and 4th grader.   He reports that in recent months he has had some difficulty staying on track with his health.  His mother-in-law was diagnosed with liver cancer with metastases.  She has been treated in [place].  He has been running back and forth every 2 weeks to [place] with her and the family, lots of stress which is led to less than desirable diet and compliance.  He has lost weight during this period.  He needs refills on his medications and knows he is due for fasting labs.  He is nonfasting this afternoon.  He does continue to smoke.  He is checking his blood sugars.  Most of his numbers are less than 130 fasting.  He has no new symptoms or complaints.  Past Medical History:  Diagnosis Date  . Chronic constipation   . Diabetes mellitus without complication (HCC)   . Hyperlipidemia   . Pneumonia 11/2016  . Smoker   . Varicose vein of leg    ROS as in subjective  Objective: BP 130/80   Pulse 78   Temp 97.8 F (36.6 C) (Oral)   Resp 16   Ht 5' 7.5" (1.715 m)   Wt 127 lb 6.4 oz (57.8 kg)   SpO2 99%   BMI 19.66 kg/m   General appearance: alert, no distress, WD/WN,  Neck: supple, no lymphadenopathy, no thyromegaly, no masses, no bruits Heart: RRR, normal S1, S2, no murmurs Lungs: CTA bilaterally, no wheezes, rhonchi, or rales Ext: no edema Pulses: 2+ symmetric, upper and lower extremities, normal cap refill    Assessment: Encounter Diagnoses  Name Primary?  . Diabetes mellitus with complication (HCC) Yes  . Smoker   . Hyperlipidemia, unspecified hyperlipidemia type      Plan: I expressed my sympathy for his overall situation and cancer.  He will return fasting preferably this week for labs as he is due.  I refilled his current medications as below.  Counseled on glucometer testing, exercise, diet,  and smoking cessation.   Patient Instructions   Encounter Diagnoses  Name Primary?  . Diabetes mellitus with complication (HCC) Yes  . Smoker   . Hyperlipidemia, unspecified hyperlipidemia type     Your current medications include:  High Cholesterol  Pravachol 20mg  daily at bedtime for cholesterol and atherosclerosis which is plaque buildup in your heart arteries  Diabetes:  Januvia 25mg  daily in the morning  Synjardy 12.5/1000mg  twice daily  Check your sugars several morning per week  Goal is <130 fasting in the morning    Exercise at least 150 minutes per week such as walking, running, or cycling for example  Return soon fasting in the morning for labs       Kairo was seen today for follow up dm.  Diagnoses and all orders for this visit:  Diabetes mellitus with complication (HCC) -     Comprehensive metabolic panel; Future -     Lipid panel; Future -     Hemoglobin A1c; Future -     HM DIABETES EYE EXAM -     HM DIABETES FOOT EXAM  Smoker  Hyperlipidemia, unspecified hyperlipidemia type -     Lipid panel; Future  Other orders -     sitaGLIPtin (JANUVIA) 25 MG tablet; Take 1 tablet (25 mg total) by mouth  daily. -     pravastatin (PRAVACHOL) 20 MG tablet; Take 1 tablet (20 mg total) by mouth daily. -     Empagliflozin-metFORMIN HCl (SYNJARDY) 12.08-998 MG TABS; Take 1 tablet by mouth 2 (two) times daily. -     aspirin 81 MG EC tablet; TAKE 1 TABLET(81 MG) BY MOUTH DAILY   F/u this week for fasting labs

## 2018-06-17 ENCOUNTER — Other Ambulatory Visit (INDEPENDENT_AMBULATORY_CARE_PROVIDER_SITE_OTHER): Payer: BLUE CROSS/BLUE SHIELD

## 2018-06-17 DIAGNOSIS — E785 Hyperlipidemia, unspecified: Secondary | ICD-10-CM

## 2018-06-17 DIAGNOSIS — E118 Type 2 diabetes mellitus with unspecified complications: Secondary | ICD-10-CM

## 2018-06-18 LAB — COMPREHENSIVE METABOLIC PANEL
ALT: 23 IU/L (ref 0–44)
AST: 19 IU/L (ref 0–40)
Albumin/Globulin Ratio: 1.6 (ref 1.2–2.2)
Albumin: 4.7 g/dL (ref 4.0–5.0)
Alkaline Phosphatase: 117 IU/L (ref 39–117)
BUN/Creatinine Ratio: 19 (ref 9–20)
BUN: 17 mg/dL (ref 6–24)
Bilirubin Total: 0.9 mg/dL (ref 0.0–1.2)
CO2: 24 mmol/L (ref 20–29)
CREATININE: 0.88 mg/dL (ref 0.76–1.27)
Calcium: 9.9 mg/dL (ref 8.7–10.2)
Chloride: 97 mmol/L (ref 96–106)
GFR calc Af Amer: 117 mL/min/{1.73_m2} (ref >59)
GFR calc non Af Amer: 102 mL/min/{1.73_m2} (ref >59)
Globulin, Total: 3 g/dL (ref 1.5–4.5)
Glucose: 151 mg/dL — ABNORMAL HIGH (ref 65–99)
Potassium: 5.5 mmol/L — ABNORMAL HIGH (ref 3.5–5.2)
Sodium: 135 mmol/L (ref 134–144)
Total Protein: 7.7 g/dL (ref 6.0–8.5)

## 2018-06-18 LAB — HEMOGLOBIN A1C
Est. average glucose Bld gHb Est-mCnc: 237 mg/dL
Hgb A1c MFr Bld: 9.9 % — ABNORMAL HIGH (ref 4.8–5.6)

## 2018-06-18 LAB — LIPID PANEL
Chol/HDL Ratio: 3.6 ratio (ref 0.0–5.0)
Cholesterol, Total: 144 mg/dL (ref 100–199)
HDL: 40 mg/dL (ref >39)
LDL Calculated: 71 mg/dL (ref 0–99)
Triglycerides: 165 mg/dL — ABNORMAL HIGH (ref 0–149)
VLDL Cholesterol Cal: 33 mg/dL (ref 5–40)

## 2018-07-11 ENCOUNTER — Encounter: Payer: BLUE CROSS/BLUE SHIELD | Admitting: Medical

## 2018-07-14 ENCOUNTER — Encounter: Payer: BLUE CROSS/BLUE SHIELD | Admitting: Medical

## 2018-08-27 ENCOUNTER — Encounter: Payer: Self-pay | Admitting: Medical

## 2021-08-05 ENCOUNTER — Ambulatory Visit (HOSPITAL_COMMUNITY)
Admission: EM | Admit: 2021-08-05 | Discharge: 2021-08-05 | Disposition: A | Discharge status: Home / Self Care | Payer: PRIVATE HEALTH INSURANCE | Attending: Emergency Medicine | Admitting: Emergency Medicine

## 2021-08-05 DIAGNOSIS — K5904 Chronic idiopathic constipation: Secondary | ICD-10-CM

## 2021-08-05 MED ORDER — LINACLOTIDE 145 MCG PO CAPS: 145.0000 ug | ORAL_CAPSULE | Freq: Every day | ORAL | 0 refills | Status: AC

## 2021-08-05 NOTE — ED Triage Notes (Signed)
C/o stomach pain x 1 week. No vomiting or diarrhea.  ?

## 2021-08-05 NOTE — Discharge Instructions (Signed)
Please begin Linzess 1 capsule daily to produce regular bowel movements.  You can take this medication with or without food but it is recommended that you take it 30 minutes before eating a meal.  Please drink plenty of water when you take Linzess. ? ?As we discussed, this medication can be very expensive if your insurance will not pay for it. ? ?If your insurance will not pay for this medication, I recommend that you take MiraLAX instead. ? ?Instead of taking 1 dose per day, I want you to mix 1 capful of the MiraLAX powder into a glass of water, at least 8 ounces of water, and drink a full glass every 1-2 hours until you produce a meaningful bowel movement.  Once you produce a meaningful bowel movement, you can decrease the frequency of the MiraLAX powder to twice daily.  Once your bowel movements have become more consistent, then decrease the frequency of MiraLAX powder to once daily.   ? ?Please follow-up with your primary care provider soon as possible to discuss your constipation as it may be related to your diabetes. ? ?Thank you for visiting urgent care today. ?

## 2021-08-07 NOTE — ED Provider Notes (Signed)
?[facility] ? ? ? ?CSN: [id number] ?Arrival date & time: 08/05/21  1620 ?  ? ?HISTORY  ?No chief complaint on file. ? ?HPI ?Gordon Perez is a 51 y.o. male. Patient complains of pain in his abdomen for 1 week.  Patient reports a history of constipation.  Patient states last bowel movement was 2 days ago.  Patient states his stool has been hard and firm.  Patient states he has tried taking MiraLAX once without relief. ? ?The history is provided by the patient. No language interpreter was used.  ?Past Medical History:  ?Diagnosis Date  ? Chronic constipation   ? Diabetes mellitus without complication ([place])   ? Hyperlipidemia   ? Pneumonia 11/2016  ? Smoker   ? Varicose vein of leg   ? ?Patient Active Problem List  ? Diagnosis Date Noted  ? Cough 01/15/2017  ? Abnormal chest x-ray 01/15/2017  ? Screening for heart disease 01/15/2017  ? Low testosterone 01/15/2017  ? Low libido 11/14/2016  ? Erectile dysfunction 11/14/2016  ? Routine general medical examination at a health care facility 11/14/2016  ? Hyperlipidemia 08/08/2016  ? Varicose vein of leg 08/08/2016  ? Elevated blood-pressure reading without diagnosis of hypertension 02/20/2016  ? Noncompliance 02/20/2016  ? Chronic constipation 05/24/2015  ? Diabetes mellitus with complication ([place]) 14/01/3012  ? Encounter for health maintenance examination in adult 05/24/2015  ? Smoker 05/24/2015  ? Need for prophylactic vaccination against Streptococcus pneumoniae (pneumococcus) 05/24/2015  ? Need for prophylactic vaccination and inoculation against influenza 05/24/2015  ? Sebaceous cyst 05/24/2015  ? ?Past Surgical History:  ?Procedure Laterality Date  ? NO PAST SURGERIES  01/2017  ? ? ?Home Medications   ? ?Prior to Admission medications   ?Medication Sig Start Date End Date Taking? Authorizing Provider  ?linaclotide Rolan Lipa) 145 MCG CAPS capsule Take 1 capsule (145 mcg total) by mouth daily before breakfast. 08/05/21 09/04/21 Yes Lynden Oxford Scales, PA-C   ?aspirin 81 MG EC tablet TAKE 1 TABLET(81 MG) BY MOUTH DAILY 03/17/18   Tysinger, Camelia Eng, PA-C  ?BAYER CONTOUR NEXT TEST test strip USE TO TEST BLOOD SUGAR DAILY 06/28/16   Tysinger, Camelia Eng, PA-C  ?blood glucose meter kit and supplies KIT Dispense based on patient and insurance preference. Test once daily 05/25/15   Tysinger, Camelia Eng, PA-C  ?Empagliflozin-metFORMIN HCl (SYNJARDY) 12.08-998 MG TABS Take 1 tablet by mouth 2 (two) times daily. 03/17/18   Tysinger, Camelia Eng, PA-C  ?pravastatin (PRAVACHOL) 20 MG tablet Take 1 tablet (20 mg total) by mouth daily. 03/17/18   Tysinger, Camelia Eng, PA-C  ?sitaGLIPtin (JANUVIA) 25 MG tablet Take 1 tablet (25 mg total) by mouth daily. 03/17/18   Tysinger, Camelia Eng, PA-C  ? ? ?Family History ?Family History  ?Problem Relation Age of Onset  ? Other Mother   ?     old age  ? Other Father   ?     old age  ? Diabetes Brother   ? Cancer Neg Hx   ? Heart disease Neg Hx   ? Stroke Neg Hx   ? ?Social History ?Social History  ? ?Tobacco Use  ? Smoking status: Every Day  ?  Packs/day: 1.00  ?  Years: 20.00  ?  Pack years: 20.00  ?  Types: Cigarettes  ? Smokeless tobacco: Never  ?Vaping Use  ? Vaping Use: Never used  ?Substance Use Topics  ? Alcohol use: No  ? Drug use: No  ? ?Allergies   ?Patient has  no known allergies. ? ?Review of Systems ?Review of Systems ?Pertinent findings noted in history of present illness.  ? ?Physical Exam ?Triage Vital Signs ?ED Triage Vitals  ?Enc Vitals Group  ?   BP 02/03/21 0827 (!) 147/82  ?   Pulse Rate 02/03/21 0827 72  ?   Resp 02/03/21 0827 18  ?   Temp 02/03/21 0827 98.3 ?F (36.8 ?C)  ?   Temp Source 02/03/21 0827 Oral  ?   SpO2 02/03/21 0827 98 %  ?   Weight --   ?   Height --   ?   Head Circumference --   ?   Peak Flow --   ?   Pain Score 02/03/21 0826 5  ?   Pain Loc --   ?   Pain Edu? --   ?   Excl. in [place]? --   ?No data found. ? ?Updated Vital Signs ?BP (!) 170/92 (BP Location: Left Arm)   Pulse 72   Temp 98.3 ?F (36.8 ?C) (Oral)   Resp 16   SpO2 98%   ? ?Physical Exam ?Vitals and nursing note reviewed.  ?Constitutional:   ?   General: He is not in acute distress. ?   Appearance: Normal appearance. He is not ill-appearing.  ?HENT:  ?   Head: Normocephalic and atraumatic.  ?Eyes:  ?   General: Lids are normal.     ?   Right eye: No discharge.     ?   Left eye: No discharge.  ?   Extraocular Movements: Extraocular movements intact.  ?   Conjunctiva/sclera: Conjunctivae normal.  ?   Right eye: Right conjunctiva is not injected.  ?   Left eye: Left conjunctiva is not injected.  ?Neck:  ?   Trachea: Trachea and phonation normal.  ?Cardiovascular:  ?   Rate and Rhythm: Normal rate and regular rhythm.  ?   Pulses: Normal pulses.  ?   Heart sounds: Normal heart sounds. No murmur heard. ?  No friction rub. No gallop.  ?Pulmonary:  ?   Effort: Pulmonary effort is normal. No accessory muscle usage, prolonged expiration or respiratory distress.  ?   Breath sounds: Normal breath sounds. No stridor, decreased air movement or transmitted upper airway sounds. No decreased breath sounds, wheezing, rhonchi or rales.  ?Chest:  ?   Chest wall: No tenderness.  ?Abdominal:  ?   General: Abdomen is flat. Bowel sounds are decreased. There is no distension.  ?   Tenderness: There is generalized abdominal tenderness. There is no right CVA tenderness or left CVA tenderness.  ?   Hernia: No hernia is present.  ?Musculoskeletal:     ?   General: Normal range of motion.  ?   Cervical back: Normal range of motion and neck supple. Normal range of motion.  ?Lymphadenopathy:  ?   Cervical: No cervical adenopathy.  ?Skin: ?   General: Skin is warm and dry.  ?   Findings: No erythema or rash.  ?Neurological:  ?   General: No focal deficit present.  ?   Mental Status: He is alert and oriented to person, place, and time.  ?Psychiatric:     ?   Mood and Affect: Mood normal.     ?   Behavior: Behavior normal.  ? ? ?Visual Acuity ?Right Eye Distance:   ?Left Eye Distance:   ?Bilateral Distance:    ? ?Right Eye Near:   ?Left Eye Near:    ?Bilateral  Near:    ? ?UC Couse / Diagnostics / Procedures:  ?  ?EKG ? ?Radiology ?No results found. ? ?Procedures ?Procedures (including critical care time) ? ?UC Diagnoses / Final Clinical Impressions(s)   ?I have reviewed the triage vital signs and the nursing notes. ? ?Pertinent labs & imaging results that were available during my care of the patient were reviewed by me and considered in my medical decision making (see chart for details).   ? ?Final diagnoses:  ?Chronic idiopathic constipation  ? ?Patient provided with a prescription of Linzess.  Patient advised that it is a possibility his insurance will pay for this medication and, if so, he should take MiraLAX 1 capful every 2 hours until he has had a significant bowel movement and can shift to 1 capful every day. ? ?ED Prescriptions   ? ? Medication Sig Dispense Auth. Provider  ? linaclotide (LINZESS) 145 MCG CAPS capsule Take 1 capsule (145 mcg total) by mouth daily before breakfast. 30 capsule Lynden Oxford Scales, PA-C  ? ?  ? ?PDMP not reviewed this encounter. ? ?Pending results:  ?Labs Reviewed - No data to display ? ?Medications Ordered in UC: ?Medications - No data to display ? ?Disposition Upon Discharge:  ?Condition: stable for discharge home ?Home: take medications as prescribed; routine discharge instructions as discussed; follow up as advised. ? ?Patient presented with an acute illness with associated systemic symptoms and significant discomfort requiring urgent management. In my opinion, this is a condition that a prudent lay person (someone who possesses an average knowledge of health and medicine) may potentially expect to result in complications if not addressed urgently such as respiratory distress, impairment of bodily function or dysfunction of bodily organs.  ? ?Routine symptom specific, illness specific and/or disease specific instructions were discussed with the patient and/or caregiver at  length.  ? ?As such, the patient has been evaluated and assessed, work-up was performed and treatment was provided in alignment with urgent care protocols and evidence based medicine.  Patient/parent/caregiver has bee

## 2021-12-14 DIAGNOSIS — N528 Other male erectile dysfunction: Secondary | ICD-10-CM | POA: Diagnosis not present

## 2021-12-14 DIAGNOSIS — Z23 Encounter for immunization: Secondary | ICD-10-CM | POA: Diagnosis not present

## 2021-12-14 DIAGNOSIS — E1165 Type 2 diabetes mellitus with hyperglycemia: Secondary | ICD-10-CM | POA: Diagnosis not present

## 2021-12-14 DIAGNOSIS — E782 Mixed hyperlipidemia: Secondary | ICD-10-CM | POA: Diagnosis not present

## 2021-12-14 DIAGNOSIS — I10 Essential (primary) hypertension: Secondary | ICD-10-CM | POA: Diagnosis not present

## 2021-12-14 DIAGNOSIS — Z720 Tobacco use: Secondary | ICD-10-CM | POA: Diagnosis not present

## 2022-02-01 DIAGNOSIS — I10 Essential (primary) hypertension: Secondary | ICD-10-CM | POA: Diagnosis not present

## 2022-02-01 DIAGNOSIS — E1165 Type 2 diabetes mellitus with hyperglycemia: Secondary | ICD-10-CM | POA: Diagnosis not present

## 2022-02-01 DIAGNOSIS — E782 Mixed hyperlipidemia: Secondary | ICD-10-CM | POA: Diagnosis not present

## 2022-02-01 DIAGNOSIS — Z720 Tobacco use: Secondary | ICD-10-CM | POA: Diagnosis not present

## 2022-02-01 DIAGNOSIS — N528 Other male erectile dysfunction: Secondary | ICD-10-CM | POA: Diagnosis not present

## 2022-02-13 DIAGNOSIS — N528 Other male erectile dysfunction: Secondary | ICD-10-CM | POA: Diagnosis not present

## 2022-02-13 DIAGNOSIS — Z720 Tobacco use: Secondary | ICD-10-CM | POA: Diagnosis not present

## 2022-02-13 DIAGNOSIS — I10 Essential (primary) hypertension: Secondary | ICD-10-CM | POA: Diagnosis not present

## 2022-02-13 DIAGNOSIS — E782 Mixed hyperlipidemia: Secondary | ICD-10-CM | POA: Diagnosis not present

## 2022-02-13 DIAGNOSIS — E1165 Type 2 diabetes mellitus with hyperglycemia: Secondary | ICD-10-CM | POA: Diagnosis not present

## 2022-03-15 DIAGNOSIS — E1165 Type 2 diabetes mellitus with hyperglycemia: Secondary | ICD-10-CM | POA: Diagnosis not present

## 2022-03-15 DIAGNOSIS — E782 Mixed hyperlipidemia: Secondary | ICD-10-CM | POA: Diagnosis not present

## 2022-03-15 DIAGNOSIS — N528 Other male erectile dysfunction: Secondary | ICD-10-CM | POA: Diagnosis not present

## 2022-03-15 DIAGNOSIS — Z720 Tobacco use: Secondary | ICD-10-CM | POA: Diagnosis not present

## 2022-03-15 DIAGNOSIS — I10 Essential (primary) hypertension: Secondary | ICD-10-CM | POA: Diagnosis not present

## 2022-03-15 DIAGNOSIS — M47816 Spondylosis without myelopathy or radiculopathy, lumbar region: Secondary | ICD-10-CM | POA: Diagnosis not present

## 2022-06-14 DIAGNOSIS — E1165 Type 2 diabetes mellitus with hyperglycemia: Secondary | ICD-10-CM | POA: Diagnosis not present

## 2022-06-14 DIAGNOSIS — M47816 Spondylosis without myelopathy or radiculopathy, lumbar region: Secondary | ICD-10-CM | POA: Diagnosis not present

## 2022-06-14 DIAGNOSIS — N528 Other male erectile dysfunction: Secondary | ICD-10-CM | POA: Diagnosis not present

## 2022-06-14 DIAGNOSIS — E782 Mixed hyperlipidemia: Secondary | ICD-10-CM | POA: Diagnosis not present

## 2022-06-14 DIAGNOSIS — I10 Essential (primary) hypertension: Secondary | ICD-10-CM | POA: Diagnosis not present

## 2022-06-14 DIAGNOSIS — Z0001 Encounter for general adult medical examination with abnormal findings: Secondary | ICD-10-CM | POA: Diagnosis not present

## 2022-06-14 DIAGNOSIS — Z125 Encounter for screening for malignant neoplasm of prostate: Secondary | ICD-10-CM | POA: Diagnosis not present

## 2022-06-14 DIAGNOSIS — Z720 Tobacco use: Secondary | ICD-10-CM | POA: Diagnosis not present

## 2022-06-28 DIAGNOSIS — R809 Proteinuria, unspecified: Secondary | ICD-10-CM | POA: Diagnosis not present

## 2022-06-28 DIAGNOSIS — Z720 Tobacco use: Secondary | ICD-10-CM | POA: Diagnosis not present

## 2022-06-28 DIAGNOSIS — N528 Other male erectile dysfunction: Secondary | ICD-10-CM | POA: Diagnosis not present

## 2022-06-28 DIAGNOSIS — E782 Mixed hyperlipidemia: Secondary | ICD-10-CM | POA: Diagnosis not present

## 2022-06-28 DIAGNOSIS — E1129 Type 2 diabetes mellitus with other diabetic kidney complication: Secondary | ICD-10-CM | POA: Diagnosis not present

## 2022-06-28 DIAGNOSIS — E1165 Type 2 diabetes mellitus with hyperglycemia: Secondary | ICD-10-CM | POA: Diagnosis not present

## 2022-06-28 DIAGNOSIS — I10 Essential (primary) hypertension: Secondary | ICD-10-CM | POA: Diagnosis not present

## 2022-10-01 DIAGNOSIS — I10 Essential (primary) hypertension: Secondary | ICD-10-CM | POA: Diagnosis not present

## 2022-10-01 DIAGNOSIS — R809 Proteinuria, unspecified: Secondary | ICD-10-CM | POA: Diagnosis not present

## 2022-10-01 DIAGNOSIS — Z720 Tobacco use: Secondary | ICD-10-CM | POA: Diagnosis not present

## 2022-10-01 DIAGNOSIS — E1129 Type 2 diabetes mellitus with other diabetic kidney complication: Secondary | ICD-10-CM | POA: Diagnosis not present

## 2022-10-01 DIAGNOSIS — N528 Other male erectile dysfunction: Secondary | ICD-10-CM | POA: Diagnosis not present

## 2022-10-01 DIAGNOSIS — E1165 Type 2 diabetes mellitus with hyperglycemia: Secondary | ICD-10-CM | POA: Diagnosis not present

## 2022-10-01 DIAGNOSIS — E782 Mixed hyperlipidemia: Secondary | ICD-10-CM | POA: Diagnosis not present

## 2023-02-18 DIAGNOSIS — F439 Reaction to severe stress, unspecified: Secondary | ICD-10-CM | POA: Diagnosis not present

## 2023-02-18 DIAGNOSIS — N528 Other male erectile dysfunction: Secondary | ICD-10-CM | POA: Diagnosis not present

## 2023-02-18 DIAGNOSIS — I10 Essential (primary) hypertension: Secondary | ICD-10-CM | POA: Diagnosis not present

## 2023-02-18 DIAGNOSIS — Z23 Encounter for immunization: Secondary | ICD-10-CM | POA: Diagnosis not present

## 2023-02-18 DIAGNOSIS — E1165 Type 2 diabetes mellitus with hyperglycemia: Secondary | ICD-10-CM | POA: Diagnosis not present

## 2023-02-18 DIAGNOSIS — Z720 Tobacco use: Secondary | ICD-10-CM | POA: Diagnosis not present

## 2023-02-18 DIAGNOSIS — E782 Mixed hyperlipidemia: Secondary | ICD-10-CM | POA: Diagnosis not present

## 2023-02-18 DIAGNOSIS — M1990 Unspecified osteoarthritis, unspecified site: Secondary | ICD-10-CM | POA: Diagnosis not present

## 2023-02-18 DIAGNOSIS — E1129 Type 2 diabetes mellitus with other diabetic kidney complication: Secondary | ICD-10-CM | POA: Diagnosis not present

## 2023-02-26 DIAGNOSIS — F4320 Adjustment disorder, unspecified: Secondary | ICD-10-CM | POA: Diagnosis not present

## 2023-05-02 DIAGNOSIS — E782 Mixed hyperlipidemia: Secondary | ICD-10-CM | POA: Diagnosis not present

## 2023-05-02 DIAGNOSIS — N528 Other male erectile dysfunction: Secondary | ICD-10-CM | POA: Diagnosis not present

## 2023-05-02 DIAGNOSIS — E1165 Type 2 diabetes mellitus with hyperglycemia: Secondary | ICD-10-CM | POA: Diagnosis not present

## 2023-05-02 DIAGNOSIS — Z720 Tobacco use: Secondary | ICD-10-CM | POA: Diagnosis not present

## 2023-05-02 DIAGNOSIS — R809 Proteinuria, unspecified: Secondary | ICD-10-CM | POA: Diagnosis not present

## 2023-05-02 DIAGNOSIS — I10 Essential (primary) hypertension: Secondary | ICD-10-CM | POA: Diagnosis not present

## 2023-05-02 DIAGNOSIS — M1990 Unspecified osteoarthritis, unspecified site: Secondary | ICD-10-CM | POA: Diagnosis not present

## 2023-05-02 DIAGNOSIS — F439 Reaction to severe stress, unspecified: Secondary | ICD-10-CM | POA: Diagnosis not present

## 2023-05-02 DIAGNOSIS — E1129 Type 2 diabetes mellitus with other diabetic kidney complication: Secondary | ICD-10-CM | POA: Diagnosis not present

## 2023-05-16 DIAGNOSIS — I10 Essential (primary) hypertension: Secondary | ICD-10-CM | POA: Diagnosis not present

## 2023-05-16 DIAGNOSIS — E1165 Type 2 diabetes mellitus with hyperglycemia: Secondary | ICD-10-CM | POA: Diagnosis not present

## 2023-05-16 DIAGNOSIS — N528 Other male erectile dysfunction: Secondary | ICD-10-CM | POA: Diagnosis not present

## 2023-05-16 DIAGNOSIS — Z720 Tobacco use: Secondary | ICD-10-CM | POA: Diagnosis not present

## 2023-05-16 DIAGNOSIS — M1990 Unspecified osteoarthritis, unspecified site: Secondary | ICD-10-CM | POA: Diagnosis not present

## 2023-05-16 DIAGNOSIS — E1129 Type 2 diabetes mellitus with other diabetic kidney complication: Secondary | ICD-10-CM | POA: Diagnosis not present

## 2023-05-16 DIAGNOSIS — E782 Mixed hyperlipidemia: Secondary | ICD-10-CM | POA: Diagnosis not present

## 2023-07-05 DIAGNOSIS — E1165 Type 2 diabetes mellitus with hyperglycemia: Secondary | ICD-10-CM | POA: Diagnosis not present

## 2023-07-05 DIAGNOSIS — I10 Essential (primary) hypertension: Secondary | ICD-10-CM | POA: Diagnosis not present

## 2023-07-05 DIAGNOSIS — E782 Mixed hyperlipidemia: Secondary | ICD-10-CM | POA: Diagnosis not present

## 2023-07-05 DIAGNOSIS — N528 Other male erectile dysfunction: Secondary | ICD-10-CM | POA: Diagnosis not present

## 2023-07-05 DIAGNOSIS — M1990 Unspecified osteoarthritis, unspecified site: Secondary | ICD-10-CM | POA: Diagnosis not present

## 2023-07-05 DIAGNOSIS — Z720 Tobacco use: Secondary | ICD-10-CM | POA: Diagnosis not present

## 2023-07-05 DIAGNOSIS — E1129 Type 2 diabetes mellitus with other diabetic kidney complication: Secondary | ICD-10-CM | POA: Diagnosis not present

## 2023-07-05 DIAGNOSIS — Z0000 Encounter for general adult medical examination without abnormal findings: Secondary | ICD-10-CM | POA: Diagnosis not present

## 2023-07-05 DIAGNOSIS — Z125 Encounter for screening for malignant neoplasm of prostate: Secondary | ICD-10-CM | POA: Diagnosis not present

## 2023-07-19 DIAGNOSIS — Z720 Tobacco use: Secondary | ICD-10-CM | POA: Diagnosis not present

## 2023-07-19 DIAGNOSIS — E1165 Type 2 diabetes mellitus with hyperglycemia: Secondary | ICD-10-CM | POA: Diagnosis not present

## 2023-07-19 DIAGNOSIS — M1990 Unspecified osteoarthritis, unspecified site: Secondary | ICD-10-CM | POA: Diagnosis not present

## 2023-07-19 DIAGNOSIS — N528 Other male erectile dysfunction: Secondary | ICD-10-CM | POA: Diagnosis not present

## 2023-07-19 DIAGNOSIS — E782 Mixed hyperlipidemia: Secondary | ICD-10-CM | POA: Diagnosis not present

## 2023-07-19 DIAGNOSIS — E559 Vitamin D deficiency, unspecified: Secondary | ICD-10-CM | POA: Diagnosis not present

## 2023-07-19 DIAGNOSIS — I10 Essential (primary) hypertension: Secondary | ICD-10-CM | POA: Diagnosis not present

## 2023-10-18 DIAGNOSIS — I10 Essential (primary) hypertension: Secondary | ICD-10-CM | POA: Diagnosis not present

## 2023-10-18 DIAGNOSIS — N528 Other male erectile dysfunction: Secondary | ICD-10-CM | POA: Diagnosis not present

## 2023-10-18 DIAGNOSIS — E559 Vitamin D deficiency, unspecified: Secondary | ICD-10-CM | POA: Diagnosis not present

## 2023-10-18 DIAGNOSIS — Z720 Tobacco use: Secondary | ICD-10-CM | POA: Diagnosis not present

## 2023-10-18 DIAGNOSIS — E782 Mixed hyperlipidemia: Secondary | ICD-10-CM | POA: Diagnosis not present

## 2023-10-18 DIAGNOSIS — E1165 Type 2 diabetes mellitus with hyperglycemia: Secondary | ICD-10-CM | POA: Diagnosis not present

## 2024-01-14 NOTE — Progress Notes (Signed)
 Jud Fanguy                                          MRN: [id number]   01/14/2024   The [facility] Quality Team Specialist reviewed this patient medical record for the purposes of chart review for care gap closure. The following were reviewed: chart review for care gap closure-colorectal cancer screening, diabetic eye exam, and glycemic status assessment.    [facility] Quality Team
